# Patient Record
Sex: Female | Born: 1975 | Race: Black or African American | Hispanic: Yes | Marital: Single | Smoking: Current every day smoker
Health system: Southern US, Community
[De-identification: ages and names within clinical notes are randomized; demographics above are authoritative.]

## PROBLEM LIST (undated history)

## (undated) DIAGNOSIS — Z789 Other specified health status: Secondary | ICD-10-CM

## (undated) DIAGNOSIS — O24419 Gestational diabetes mellitus in pregnancy, unspecified control: Secondary | ICD-10-CM

## (undated) HISTORY — DX: Gestational diabetes mellitus in pregnancy, unspecified control: O24.419

## (undated) HISTORY — PX: NO PAST SURGERIES: SHX2092

---
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
## 2018-06-20 ENCOUNTER — Ambulatory Visit: Payer: Self-pay

## 2019-01-09 ENCOUNTER — Ambulatory Visit: Payer: Self-pay

## 2019-01-15 ENCOUNTER — Telehealth: Payer: Self-pay | Admitting: *Deleted

## 2019-01-15 ENCOUNTER — Ambulatory Visit: Payer: Self-pay

## 2019-01-15 NOTE — Telephone Encounter (Signed)
Tried to call patient regarding appointment this AM. No answer, no voicemail. Nurse will be out of office.   Clovis Pu, RN

## 2019-01-23 ENCOUNTER — Ambulatory Visit (INDEPENDENT_AMBULATORY_CARE_PROVIDER_SITE_OTHER): Payer: Self-pay | Admitting: *Deleted

## 2019-01-23 ENCOUNTER — Other Ambulatory Visit: Payer: Self-pay

## 2019-01-23 ENCOUNTER — Encounter: Payer: Self-pay | Admitting: General Practice

## 2019-01-23 VITALS — BP 111/72 | HR 102 | Temp 98.0°F | Ht 62.0 in | Wt 148.0 lb

## 2019-01-23 DIAGNOSIS — Z3490 Encounter for supervision of normal pregnancy, unspecified, unspecified trimester: Secondary | ICD-10-CM

## 2019-01-23 DIAGNOSIS — Z3200 Encounter for pregnancy test, result unknown: Secondary | ICD-10-CM

## 2019-01-23 DIAGNOSIS — Z3201 Encounter for pregnancy test, result positive: Secondary | ICD-10-CM

## 2019-01-23 LAB — POCT URINE PREGNANCY: Preg Test, Ur: POSITIVE — AB

## 2019-01-23 MED ORDER — ONE-A-DAY WOMENS PRENATAL 1 28-0.8-235 MG PO CAPS: 1.0000 | ORAL_CAPSULE | Freq: Every day | ORAL | 0 refills | Status: DC

## 2019-01-23 NOTE — Addendum Note (Signed)
Addended by: Derl Barrow on: 01/23/2019 02:12 PM   Modules accepted: Orders

## 2019-01-23 NOTE — Progress Notes (Signed)
   Ms. Askari presents today for UPT. She has no unusual complaints. LMP: 12/07/2018    OBJECTIVE: Appears well, in no apparent distress.  OB History    Gravida  1   Para      Term      Preterm      AB      Living        SAB      TAB      Ectopic      Multiple      Live Births             Home UPT Result: Positive In-Office UPT result: Positive I have reviewed the patient's medical, obstetrical, social, and family histories, and medications.   ASSESSMENT: Positive pregnancy test  PLAN Prenatal care to be completed at: [facility]  Derl Barrow, RN

## 2019-01-30 ENCOUNTER — Other Ambulatory Visit: Payer: Self-pay

## 2019-01-30 ENCOUNTER — Ambulatory Visit (INDEPENDENT_AMBULATORY_CARE_PROVIDER_SITE_OTHER): Payer: Self-pay | Admitting: *Deleted

## 2019-01-30 VITALS — Wt 148.0 lb

## 2019-01-30 DIAGNOSIS — Z3480 Encounter for supervision of other normal pregnancy, unspecified trimester: Secondary | ICD-10-CM

## 2019-01-30 DIAGNOSIS — O0990 Supervision of high risk pregnancy, unspecified, unspecified trimester: Secondary | ICD-10-CM | POA: Insufficient documentation

## 2019-01-30 NOTE — Progress Notes (Signed)
    Virtual Visit via Telephone Note  I connected with [name] on 01/30/19 at  2:30 PM EDT by telephone and verified that I am speaking with the correct person using two identifiers.  Location: Patient: Regina Cross MRN: [id number] Provider: Derl Barrow, RN   I discussed the limitations, risks, security and privacy concerns of performing an evaluation and management service by telephone and the availability of in person appointments. I also discussed with the patient that there may be a patient responsible charge related to this service. The patient expressed understanding and agreed to proceed.   History of Present Illness: PRENATAL INTAKE SUMMARY  [name] presents today New OB Nurse Interview.  OB History    Gravida  6   Para  3   Term  3   Preterm      AB  2   Living  3     SAB  2   TAB      Ectopic      Multiple      Live Births  3          I have reviewed the patient's medical, obstetrical, social, and family histories, medications, and available lab results.  SUBJECTIVE She has no unusual complaints   Observations/Objective: Initial nurse interview for history (New OB).  EDD: 09/13/2019 by LMP GA: [redacted]w[redacted]d G6P3023  GENERAL APPEARANCE: oriented to person, place and time, non-face to face; telephone visit.  Assessment and Plan: Normal pregnancy Prenatal care [facility] Lab work will be completed at next visit. Ultrasound <14 weeks ordered. Continue PNV  Follow Up Instructions:  I discussed the assessment and treatment plan with the patient. The patient was provided an opportunity to ask questions and all were answered. The patient agreed with the plan and demonstrated an understanding of the instructions.   The patient was advised to call back or seek an in-person evaluation if the symptoms worsen or if the condition fails to improve as anticipated.  I provided 20 minutes of non-face-to-face time during this encounter.   Derl Barrow, RN

## 2019-02-08 ENCOUNTER — Ambulatory Visit (HOSPITAL_COMMUNITY): Payer: Self-pay

## 2019-02-15 ENCOUNTER — Telehealth: Payer: Self-pay | Admitting: Obstetrics and Gynecology

## 2019-02-15 NOTE — Telephone Encounter (Signed)
Called the patient to complete the pre-screen. The patient answered no to COVID19 symptoms and/or being previously diagnosed. Informed the patient of the wearing a face mask, sanitizing hands at the sanitizing station upon entering our office, and no visitors or children are allowed due to the COVID19 restrictions. The patient verbalized understanding. °

## 2019-02-19 ENCOUNTER — Ambulatory Visit (HOSPITAL_COMMUNITY): Admission: RE | Admit: 2019-02-19 | Payer: Self-pay | Source: Ambulatory Visit

## 2019-02-19 ENCOUNTER — Ambulatory Visit: Payer: Self-pay

## 2019-02-21 ENCOUNTER — Ambulatory Visit (INDEPENDENT_AMBULATORY_CARE_PROVIDER_SITE_OTHER): Payer: Self-pay

## 2019-02-21 ENCOUNTER — Ambulatory Visit (HOSPITAL_COMMUNITY)
Admission: RE | Admit: 2019-02-21 | Discharge: 2019-02-21 | Disposition: A | Discharge status: Home / Self Care | Payer: Self-pay | Source: Ambulatory Visit | Attending: Obstetrics and Gynecology | Admitting: Obstetrics and Gynecology

## 2019-02-21 ENCOUNTER — Other Ambulatory Visit: Payer: Self-pay

## 2019-02-21 DIAGNOSIS — Z3480 Encounter for supervision of other normal pregnancy, unspecified trimester: Secondary | ICD-10-CM | POA: Insufficient documentation

## 2019-02-21 DIAGNOSIS — Z712 Person consulting for explanation of examination or test findings: Secondary | ICD-10-CM

## 2019-02-21 NOTE — Progress Notes (Signed)
Pt here today for OB US results.  Per results pt has possible cystic hygroma which requires a provider to speak with pt.  Due to the full provider schedule [place] agreed to pt being scheduled at [facility] (pt belongs to practice) for an appt for results.  Pt scheduled an appt for July 9 @ 1515 for MyChart visit for results.  Notified pt of appt and the reason why.  Pt verbalized understanding with no further questions.  Pt given [place] pics.    Mel Almond, RN 02/21/19

## 2019-02-22 ENCOUNTER — Telehealth: Payer: Self-pay | Admitting: General Practice

## 2019-02-22 ENCOUNTER — Telehealth: Payer: Self-pay | Admitting: Obstetrics and Gynecology

## 2019-02-22 ENCOUNTER — Other Ambulatory Visit: Payer: Self-pay | Admitting: Obstetrics and Gynecology

## 2019-02-22 NOTE — Telephone Encounter (Signed)
Attempted to call pt twice to check in for Mychart video visit.  Unable to leave a message due to voice mail not set up.

## 2019-02-23 ENCOUNTER — Telehealth (INDEPENDENT_AMBULATORY_CARE_PROVIDER_SITE_OTHER): Payer: Self-pay | Admitting: Family

## 2019-02-23 DIAGNOSIS — O283 Abnormal ultrasonic finding on antenatal screening of mother: Secondary | ICD-10-CM

## 2019-02-23 NOTE — Progress Notes (Signed)
   TELEHEALTH VIRTUAL OBSTETRICS VISIT ENCOUNTER NOTE  I connected with Regina Cross on 02/23/19 at  9:10 AM EDT by video visit at home and verified that I am speaking with the correct person using two identifiers.   I discussed the limitations, risks, security and privacy concerns of performing an evaluation and management service by telephone and the availability of in person appointments. I also discussed with the patient that there may be a patient responsible charge related to this service. The patient expressed understanding and agreed to proceed.  Regina Cross is a 43 y.o. [id number] at [redacted]w[redacted]d being followed for ongoing prenatal care.  She is currently monitored for the following issues for this high-risk pregnancy.  Pt called to review 1st trimester ultrasound results:  CRL gives approximate gestational age of 11.1 day.  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   Reviewed with patient results of ultrasound and explained that cystic hygromas are associated with an increased risk for fetal aneuploidy (particularly trisomy 21) and other structural malformations.  Recommended meeting with genetic counselor and MFM to review additional screening and diagnostic options.  Briefly reviewed NIPS, CVS, and amnio.  Plan includes to schedule genetic counseling and MFM visit.  Pt given opportunity to ask additional questions.  No questions at this time.   Cyndee Brightly, CNM

## 2019-03-05 ENCOUNTER — Ambulatory Visit (HOSPITAL_COMMUNITY): Payer: Self-pay

## 2019-03-07 ENCOUNTER — Ambulatory Visit (HOSPITAL_COMMUNITY): Payer: Self-pay | Attending: Obstetrics and Gynecology

## 2019-03-07 ENCOUNTER — Encounter: Payer: Self-pay | Admitting: General Practice

## 2019-03-07 ENCOUNTER — Encounter: Payer: Self-pay | Admitting: Obstetrics and Gynecology

## 2019-03-07 ENCOUNTER — Telehealth: Payer: Self-pay | Admitting: General Practice

## 2019-03-07 NOTE — Telephone Encounter (Signed)
Unable to reach pt to reschedule appointment and also unable to leave a message on voice mail d/t voice mail not set up.  Letter mailed to patient.

## 2019-03-28 ENCOUNTER — Encounter: Payer: Self-pay | Admitting: Obstetrics & Gynecology

## 2019-03-28 ENCOUNTER — Encounter: Payer: Self-pay | Admitting: Family Medicine

## 2019-04-18 ENCOUNTER — Other Ambulatory Visit: Payer: Self-pay

## 2019-04-18 ENCOUNTER — Ambulatory Visit (INDEPENDENT_AMBULATORY_CARE_PROVIDER_SITE_OTHER): Payer: Self-pay | Admitting: Obstetrics & Gynecology

## 2019-04-18 ENCOUNTER — Encounter: Payer: Self-pay | Admitting: Obstetrics & Gynecology

## 2019-04-18 VITALS — BP 108/70 | HR 71 | Temp 98.4°F | Wt 148.4 lb

## 2019-04-18 DIAGNOSIS — O09522 Supervision of elderly multigravida, second trimester: Secondary | ICD-10-CM

## 2019-04-18 DIAGNOSIS — Z3A18 18 weeks gestation of pregnancy: Secondary | ICD-10-CM

## 2019-04-18 DIAGNOSIS — O0990 Supervision of high risk pregnancy, unspecified, unspecified trimester: Secondary | ICD-10-CM

## 2019-04-18 DIAGNOSIS — O09529 Supervision of elderly multigravida, unspecified trimester: Secondary | ICD-10-CM | POA: Insufficient documentation

## 2019-04-18 DIAGNOSIS — O283 Abnormal ultrasonic finding on antenatal screening of mother: Secondary | ICD-10-CM

## 2019-04-18 DIAGNOSIS — O0992 Supervision of high risk pregnancy, unspecified, second trimester: Secondary | ICD-10-CM

## 2019-04-18 NOTE — Progress Notes (Signed)
  Subjective:    Regina Cross is a [id number] [redacted]w[redacted]d being seen today for her first obstetrical visit.  Her obstetrical history is significant for advanced maternal age and possible cystic hygroma at 11 weeks. Patient does not intend to breast feed. Pregnancy history fully reviewed.  Patient reports no complaints.  Vitals:   04/18/19 1539  BP: 108/70  Pulse: 71  Temp: 98.4 F (36.9 C)  Weight: 148 lb 6.4 oz (67.3 kg)    HISTORY: OB History  Gravida Para Term Preterm AB Living  6 3 3   2 3   SAB TAB Ectopic Multiple Live Births  2       3    # Outcome Date GA Lbr Len/2nd Weight Sex Delivery Anes PTL Lv  6 Current           5 Term 07/23/07     Vag-Spont  N LIV  4 Term 09/14/98     Vag-Spont  N LIV  3 Term 10/31/93     Vag-Spont  N LIV  2 SAB           1 SAB            Past Medical History:  Diagnosis Date  . Medical history non-contributory    Past Surgical History:  Procedure Laterality Date  . NO PAST SURGERIES     Family History  Problem Relation Age of Onset  . Hypertension Mother      Exam    Uterus:   19 weeks  Pelvic Exam:    Perineum: Not performed   Vulva:    Vagina:     pH:    Cervix:    Adnexa:    Bony Pelvis:   System: Breast:  normal appearance, no masses or tenderness, positive findings: nipple inversion on the right   Skin: normal coloration and turgor, no rashes    Neurologic: oriented, normal mood   Extremities: normal strength, tone, and muscle mass   HEENT PERRLA, extra ocular movement intact and neck supple with midline trachea   Mouth/Teeth mucous membranes moist, pharynx normal without lesions   Neck supple   Cardiovascular: regular rate and rhythm, no murmurs or gallops   Respiratory:  appears well, vitals normal, no respiratory distress, acyanotic, normal RR, neck free of mass or lymphadenopathy, chest clear, no wheezing, crepitations, rhonchi, normal symmetric air entry   Abdomen: gravid   Urinary:        Assessment:    Pregnancy: A5W0981 Patient Active Problem List   Diagnosis Date Noted  . Abnormal fetal ultrasound 02/23/2019  . Supervision of high risk pregnancy, antepartum 01/30/2019        Plan:     Initial labs drawn. Prenatal vitamins. Problem list reviewed and updated. Genetic Screening discussed Panorama, genetic counseling re AMA and cystic hygroma  Ultrasound discussed; fetal survey: ordered.  Follow up in 4 weeks. 50% of 30 min visit spent on counseling and coordination of care.     Emeterio Reeve 04/18/2019

## 2019-04-18 NOTE — Patient Instructions (Signed)

## 2019-04-19 LAB — OBSTETRIC PANEL, INCLUDING HIV
Antibody Screen: NEGATIVE
Basophils Absolute: 0 10*3/uL (ref 0.0–0.2)
Basos: 0 %
EOS (ABSOLUTE): 0.1 10*3/uL (ref 0.0–0.4)
Eos: 1 %
HIV Screen 4th Generation wRfx: NONREACTIVE
Hematocrit: 31.7 % — ABNORMAL LOW (ref 34.0–46.6)
Hemoglobin: 10.6 g/dL — ABNORMAL LOW (ref 11.1–15.9)
Hepatitis B Surface Ag: NEGATIVE
Immature Grans (Abs): 0 10*3/uL (ref 0.0–0.1)
Immature Granulocytes: 0 %
Lymphocytes Absolute: 2.6 10*3/uL (ref 0.7–3.1)
Lymphs: 29 %
MCH: 29 pg (ref 26.6–33.0)
MCHC: 33.4 g/dL (ref 31.5–35.7)
MCV: 87 fL (ref 79–97)
Monocytes Absolute: 0.7 10*3/uL (ref 0.1–0.9)
Monocytes: 8 %
Neutrophils Absolute: 5.4 10*3/uL (ref 1.4–7.0)
Neutrophils: 62 %
Platelets: 268 10*3/uL (ref 150–450)
RBC: 3.66 x10E6/uL — ABNORMAL LOW (ref 3.77–5.28)
RDW: 12.2 % (ref 11.7–15.4)
RPR Ser Ql: NONREACTIVE
Rh Factor: POSITIVE
Rubella Antibodies, IGG: 13.4 index (ref >0.99)
WBC: 8.9 10*3/uL (ref 3.4–10.8)

## 2019-04-19 LAB — HEMOGLOBIN A1C
Est. average glucose Bld gHb Est-mCnc: 100 mg/dL
Hgb A1c MFr Bld: 5.1 % (ref 4.8–5.6)

## 2019-04-20 LAB — URINE CULTURE, OB REFLEX: Organism ID, Bacteria: NO GROWTH

## 2019-04-20 LAB — CULTURE, OB URINE

## 2019-04-24 ENCOUNTER — Encounter: Payer: Self-pay | Admitting: *Deleted

## 2019-05-02 ENCOUNTER — Other Ambulatory Visit (HOSPITAL_COMMUNITY): Payer: Self-pay | Admitting: *Deleted

## 2019-05-02 ENCOUNTER — Encounter (HOSPITAL_COMMUNITY): Payer: Self-pay

## 2019-05-02 ENCOUNTER — Ambulatory Visit (HOSPITAL_COMMUNITY): Payer: Self-pay

## 2019-05-02 ENCOUNTER — Ambulatory Visit (HOSPITAL_COMMUNITY): Payer: Self-pay | Admitting: *Deleted

## 2019-05-02 ENCOUNTER — Ambulatory Visit (INDEPENDENT_AMBULATORY_CARE_PROVIDER_SITE_OTHER): Payer: Self-pay | Admitting: Obstetrics and Gynecology

## 2019-05-02 ENCOUNTER — Other Ambulatory Visit: Payer: Self-pay

## 2019-05-02 ENCOUNTER — Ambulatory Visit (HOSPITAL_COMMUNITY)
Admission: RE | Admit: 2019-05-02 | Discharge: 2019-05-02 | Disposition: A | Discharge status: Home / Self Care | Payer: Self-pay | Source: Ambulatory Visit | Attending: Obstetrics and Gynecology | Admitting: Obstetrics and Gynecology

## 2019-05-02 VITALS — BP 124/84 | HR 79 | Wt 149.7 lb

## 2019-05-02 DIAGNOSIS — Z362 Encounter for other antenatal screening follow-up: Secondary | ICD-10-CM

## 2019-05-02 DIAGNOSIS — O0990 Supervision of high risk pregnancy, unspecified, unspecified trimester: Secondary | ICD-10-CM

## 2019-05-02 DIAGNOSIS — O09522 Supervision of elderly multigravida, second trimester: Secondary | ICD-10-CM

## 2019-05-02 DIAGNOSIS — Z9119 Patient's noncompliance with other medical treatment and regimen: Secondary | ICD-10-CM | POA: Insufficient documentation

## 2019-05-02 DIAGNOSIS — Z91199 Patient's noncompliance with other medical treatment and regimen due to unspecified reason: Secondary | ICD-10-CM | POA: Insufficient documentation

## 2019-05-02 DIAGNOSIS — O283 Abnormal ultrasonic finding on antenatal screening of mother: Secondary | ICD-10-CM

## 2019-05-02 DIAGNOSIS — Z3A20 20 weeks gestation of pregnancy: Secondary | ICD-10-CM

## 2019-05-02 DIAGNOSIS — O0992 Supervision of high risk pregnancy, unspecified, second trimester: Secondary | ICD-10-CM

## 2019-05-02 DIAGNOSIS — O359XX0 Maternal care for (suspected) fetal abnormality and damage, unspecified, not applicable or unspecified: Secondary | ICD-10-CM

## 2019-05-02 MED ORDER — PREPLUS 27-1 MG PO TABS: 1.0000 | ORAL_TABLET | Freq: Every day | ORAL | 3 refills | Status: DC

## 2019-05-02 MED ORDER — ASPIRIN EC 81 MG PO TBEC: 81.0000 mg | DELAYED_RELEASE_TABLET | Freq: Every day | ORAL | 3 refills | Status: DC

## 2019-05-02 NOTE — Progress Notes (Signed)
Prenatal Visit Note Date: 05/02/2019 Clinic: [facility]  Subjective:  Regina Cross is a 43 y.o. [id number] at [redacted]w[redacted]d being seen today for ongoing prenatal care.  She is currently monitored for the following issues for this high-risk pregnancy and has Supervision of high risk pregnancy, antepartum; Abnormal fetal ultrasound; AMA (advanced maternal age) multigravida 35+; and Patient noncompliance on their problem list.  Patient reports no complaints.   Contractions: Not present. Vag. Bleeding: None.  Movement: Present. Denies leaking of fluid.   The following portions of the patient's history were reviewed and updated as appropriate: allergies, current medications, past family history, past medical history, past social history, past surgical history and problem list. Problem list updated.  Objective:   Vitals:   05/02/19 1524  BP: 124/84  Pulse: 79  Weight: 149 lb 11.2 oz (67.9 kg)    Fetal Status: Fetal Heart Rate (bpm): 149   Movement: Present     General:  Alert, oriented and cooperative. Patient is in no acute distress.  Skin: Skin is warm and dry. No rash noted.   Cardiovascular: Normal heart rate noted  Respiratory: Normal respiratory effort, no problems with respiration noted  Abdomen: Soft, gravid, appropriate for gestational age. Pain/Pressure: Present     Pelvic:  Cervical exam deferred        Extremities: Normal range of motion.  Edema: None  Mental Status: Normal mood and affect. Normal behavior. Normal judgment and thought content.   Urinalysis:      Assessment and Plan:  Pregnancy: F6C1275 at [redacted]w[redacted]d  1. Supervision of high risk pregnancy, antepartum Recommended that she start low dose asa which she is amenable to Pap smear needed at some point Patient at high risk for pre-eclampsia given age and likely abnormal pregnancy. Recommend closer in office follow up.  - TSH - Protein / creatinine ratio, urine - Comprehensive metabolic panel - [place]  MFM OB FOLLOW UP; Future - AMB referral to maternal fetal medicine  2. Abnormal fetal ultrasound Follow up read from anatomy u/s from today Patient has missed many visits with [place] and MFM, which she states is due to her being in denial. Panorama cffdna still pending, but I told her that there's a high chance of fetal aneuploidy given her age and 11wk u/s findings. I d/w her re: amniocentesis and she's interested in this and would like to discuss with mfm more. Will set this up.  - [place] MFM OB FOLLOW UP; Future - AMB referral to maternal fetal medicine  3. Multigravida of advanced maternal age in second trimester Baseline labs - TSH - Protein / creatinine ratio, urine - Comprehensive metabolic panel - [place] MFM OB FOLLOW UP; Future - AMB referral to maternal fetal medicine  4. Patient noncompliance See above  Preterm labor symptoms and general obstetric precautions including but not limited to vaginal bleeding, contractions, leaking of fluid and fetal movement were reviewed in detail with the patient. Please refer to After Visit Summary for other counseling recommendations.  RTC: 2-3 wks.    Aletha Halim, MD

## 2019-05-03 LAB — COMPREHENSIVE METABOLIC PANEL
ALT: 22 IU/L (ref 0–32)
AST: 20 IU/L (ref 0–40)
Albumin/Globulin Ratio: 1.7 (ref 1.2–2.2)
Albumin: 4 g/dL (ref 3.8–4.8)
Alkaline Phosphatase: 75 IU/L (ref 39–117)
BUN/Creatinine Ratio: 18 (ref 9–23)
BUN: 9 mg/dL (ref 6–24)
Bilirubin Total: 0.2 mg/dL (ref 0.0–1.2)
CO2: 21 mmol/L (ref 20–29)
Calcium: 9.2 mg/dL (ref 8.7–10.2)
Chloride: 104 mmol/L (ref 96–106)
Creatinine, Ser: 0.5 mg/dL — ABNORMAL LOW (ref 0.57–1.00)
GFR calc Af Amer: 138 mL/min/{1.73_m2} (ref >59)
GFR calc non Af Amer: 120 mL/min/{1.73_m2} (ref >59)
Globulin, Total: 2.4 g/dL (ref 1.5–4.5)
Glucose: 79 mg/dL (ref 65–99)
Potassium: 4.3 mmol/L (ref 3.5–5.2)
Sodium: 136 mmol/L (ref 134–144)
Total Protein: 6.4 g/dL (ref 6.0–8.5)

## 2019-05-03 LAB — TSH: TSH: 1.98 u[IU]/mL (ref 0.450–4.500)

## 2019-05-18 ENCOUNTER — Encounter: Payer: Self-pay | Admitting: Obstetrics and Gynecology

## 2019-05-21 ENCOUNTER — Encounter: Payer: Self-pay | Admitting: *Deleted

## 2019-05-23 ENCOUNTER — Other Ambulatory Visit: Payer: Self-pay

## 2019-05-23 ENCOUNTER — Ambulatory Visit (INDEPENDENT_AMBULATORY_CARE_PROVIDER_SITE_OTHER): Payer: Self-pay | Admitting: Family Medicine

## 2019-05-23 VITALS — BP 106/70 | HR 88 | Wt 149.0 lb

## 2019-05-23 DIAGNOSIS — O283 Abnormal ultrasonic finding on antenatal screening of mother: Secondary | ICD-10-CM

## 2019-05-23 DIAGNOSIS — O0992 Supervision of high risk pregnancy, unspecified, second trimester: Secondary | ICD-10-CM

## 2019-05-23 DIAGNOSIS — O09522 Supervision of elderly multigravida, second trimester: Secondary | ICD-10-CM

## 2019-05-23 DIAGNOSIS — Z3A23 23 weeks gestation of pregnancy: Secondary | ICD-10-CM

## 2019-05-23 DIAGNOSIS — O0990 Supervision of high risk pregnancy, unspecified, unspecified trimester: Secondary | ICD-10-CM

## 2019-05-23 NOTE — Patient Instructions (Signed)

## 2019-05-23 NOTE — Progress Notes (Signed)
   PRENATAL VISIT NOTE  Subjective:  Regina Cross is a 43 y.o. [phone] at [redacted]w[redacted]d being seen today for ongoing prenatal care.  She is currently monitored for the following issues for this high-risk pregnancy and has Supervision of high risk pregnancy, antepartum; Abnormal fetal ultrasound; AMA (advanced maternal age) multigravida 35+; and Patient noncompliance on their problem list.  Patient reports no complaints.  Contractions: Not present. Vag. Bleeding: None.  Movement: Present. Denies leaking of fluid.   The following portions of the patient's history were reviewed and updated as appropriate: allergies, current medications, past family history, past medical history, past social history, past surgical history and problem list.   Objective:   Vitals:   05/23/19 1629  BP: 106/70  Pulse: 88  Weight: 149 lb (67.6 kg)    Fetal Status: Fetal Heart Rate (bpm): 152   Movement: Present     General:  Alert, oriented and cooperative. Patient is in no acute distress.  Skin: Skin is warm and dry. No rash noted.   Cardiovascular: Normal heart rate noted  Respiratory: Normal respiratory effort, no problems with respiration noted  Abdomen: Soft, gravid, appropriate for gestational age.  Pain/Pressure: Present     Pelvic: Cervical exam deferred        Extremities: Normal range of motion.  Edema: None  Mental Status: Normal mood and affect. Normal behavior. Normal judgment and thought content.   Assessment and Plan:  Pregnancy: [id number] at [redacted]w[redacted]d 1. Supervision of high risk pregnancy, antepartum Continue prenatal care.   2. Multigravida of advanced maternal age in second trimester Normal NIPT  3. Abnormal fetal ultrasound Cystic hygroma resolved--missed her fetal  Echo--with normal NIPT, is this still needed? If so, we will re-schedule after MFM f/u.  Preterm labor symptoms and general obstetric precautions including but not limited to vaginal bleeding, contractions, leaking of fluid and  fetal movement were reviewed in detail with the patient. Please refer to After Visit Summary for other counseling recommendations.   Return in about 3 weeks (around 06/13/2019) for in person, HRC, 28 wk labs + GTT.  Future Appointments  Date Time Provider [place]  05/28/2019  3:45 PM [place] NURSE [place] [facility]  05/28/2019  3:45 PM [place] 2 WH-MFCUS MFC-US  06/14/2019  9:30 AM [facility] WOC[facility] WOC  06/14/2019  9:55 AM [place] Mode, MD WOC-W[facility] WOC    Donnamae Jude, MD

## 2019-05-24 ENCOUNTER — Encounter: Payer: Self-pay | Admitting: *Deleted

## 2019-05-28 ENCOUNTER — Ambulatory Visit (HOSPITAL_COMMUNITY): Payer: Self-pay | Admitting: *Deleted

## 2019-05-28 ENCOUNTER — Encounter (HOSPITAL_COMMUNITY): Payer: Self-pay

## 2019-05-28 ENCOUNTER — Ambulatory Visit (HOSPITAL_COMMUNITY)
Admission: RE | Admit: 2019-05-28 | Discharge: 2019-05-28 | Disposition: A | Discharge status: Home / Self Care | Payer: Self-pay | Source: Ambulatory Visit | Attending: Obstetrics and Gynecology | Admitting: Obstetrics and Gynecology

## 2019-05-28 ENCOUNTER — Other Ambulatory Visit: Payer: Self-pay

## 2019-05-28 DIAGNOSIS — O0990 Supervision of high risk pregnancy, unspecified, unspecified trimester: Secondary | ICD-10-CM | POA: Insufficient documentation

## 2019-05-28 DIAGNOSIS — Z362 Encounter for other antenatal screening follow-up: Secondary | ICD-10-CM | POA: Insufficient documentation

## 2019-05-28 DIAGNOSIS — O283 Abnormal ultrasonic finding on antenatal screening of mother: Secondary | ICD-10-CM | POA: Insufficient documentation

## 2019-05-28 DIAGNOSIS — O4442 Low lying placenta NOS or without hemorrhage, second trimester: Secondary | ICD-10-CM

## 2019-05-28 DIAGNOSIS — O09522 Supervision of elderly multigravida, second trimester: Secondary | ICD-10-CM

## 2019-05-28 DIAGNOSIS — O359XX0 Maternal care for (suspected) fetal abnormality and damage, unspecified, not applicable or unspecified: Secondary | ICD-10-CM

## 2019-05-28 DIAGNOSIS — Z3A24 24 weeks gestation of pregnancy: Secondary | ICD-10-CM

## 2019-05-29 ENCOUNTER — Encounter: Payer: Self-pay | Admitting: General Practice

## 2019-05-29 ENCOUNTER — Other Ambulatory Visit (HOSPITAL_COMMUNITY): Payer: Self-pay | Admitting: *Deleted

## 2019-05-29 DIAGNOSIS — O09529 Supervision of elderly multigravida, unspecified trimester: Secondary | ICD-10-CM

## 2019-05-29 DIAGNOSIS — O358XX0 Maternal care for other (suspected) fetal abnormality and damage, not applicable or unspecified: Secondary | ICD-10-CM

## 2019-06-07 ENCOUNTER — Other Ambulatory Visit: Payer: Self-pay | Admitting: Lactation Services

## 2019-06-07 DIAGNOSIS — O0990 Supervision of high risk pregnancy, unspecified, unspecified trimester: Secondary | ICD-10-CM

## 2019-06-14 ENCOUNTER — Other Ambulatory Visit: Payer: Self-pay

## 2019-06-14 ENCOUNTER — Ambulatory Visit (INDEPENDENT_AMBULATORY_CARE_PROVIDER_SITE_OTHER): Payer: Self-pay | Admitting: Obstetrics & Gynecology

## 2019-06-14 VITALS — BP 116/76 | HR 103 | Wt 153.2 lb

## 2019-06-14 DIAGNOSIS — O0992 Supervision of high risk pregnancy, unspecified, second trimester: Secondary | ICD-10-CM

## 2019-06-14 DIAGNOSIS — O0990 Supervision of high risk pregnancy, unspecified, unspecified trimester: Secondary | ICD-10-CM

## 2019-06-14 DIAGNOSIS — O4422 Partial placenta previa NOS or without hemorrhage, second trimester: Secondary | ICD-10-CM

## 2019-06-14 DIAGNOSIS — O09523 Supervision of elderly multigravida, third trimester: Secondary | ICD-10-CM

## 2019-06-14 DIAGNOSIS — O09522 Supervision of elderly multigravida, second trimester: Secondary | ICD-10-CM

## 2019-06-14 DIAGNOSIS — Z3A27 27 weeks gestation of pregnancy: Secondary | ICD-10-CM

## 2019-06-14 DIAGNOSIS — O4420 Partial placenta previa NOS or without hemorrhage, unspecified trimester: Secondary | ICD-10-CM

## 2019-06-14 DIAGNOSIS — Z23 Encounter for immunization: Secondary | ICD-10-CM

## 2019-06-14 DIAGNOSIS — O283 Abnormal ultrasonic finding on antenatal screening of mother: Secondary | ICD-10-CM

## 2019-06-14 NOTE — Progress Notes (Signed)
   PRENATAL VISIT NOTE  Subjective:  Regina Cross is a 43 y.o. [phone] at [redacted]w[redacted]d being seen today for ongoing prenatal care.  She is currently monitored for the following issues for this high-risk pregnancy and has Supervision of high risk pregnancy, antepartum; Abnormal fetal ultrasound; AMA (advanced maternal age) multigravida 35+; and Patient noncompliance on their problem list.  Patient reports no complaints.  Contractions: Not present. Vag. Bleeding: None.  Movement: Present. Denies leaking of fluid.   The following portions of the patient's history were reviewed and updated as appropriate: allergies, current medications, past family history, past medical history, past social history, past surgical history and problem list.   Objective:   Vitals:   06/14/19 0955  BP: 116/76  Pulse: (!) 103  Weight: 153 lb 3.2 oz (69.5 kg)    Fetal Status: Fetal Heart Rate (bpm): 149 Fundal Height: 27 cm Movement: Present     General:  Alert, oriented and cooperative. Patient is in no acute distress.  Skin: Skin is warm and dry. No rash noted.   Cardiovascular: Normal heart rate noted  Respiratory: Normal respiratory effort, no problems with respiration noted  Abdomen: Soft, gravid, appropriate for gestational age.  Pain/Pressure: Absent     Pelvic: Cervical exam deferred        Extremities: Normal range of motion.  Edema: Trace  Mental Status: Normal mood and affect. Normal behavior. Normal judgment and thought content.   Assessment and Plan:  Pregnancy: [id number] at [redacted]w[redacted]d 1. Supervision of high risk pregnancy, antepartum Routine testing - Tdap vaccine greater than or equal to 7yo IM  2. Multigravida of advanced maternal age in third trimester F/u US for growth and placenta  3. Abnormal fetal ultrasound  Cystic hygoma resolved Preterm labor symptoms and general obstetric precautions including but not limited to vaginal bleeding, contractions, leaking of fluid and fetal movement were  reviewed in detail with the patient. Please refer to After Visit Summary for other counseling recommendations.   Return in about 2 weeks (around 06/28/2019) for 2 hr GTT.  Future Appointments  Date Time Provider [place]  06/25/2019  3:45 PM [place] NURSE [place] [facility]  06/25/2019  3:45 PM [place] 2 WH-MFCUS MFC-US    Emeterio Reeve, MD

## 2019-06-14 NOTE — Patient Instructions (Signed)

## 2019-06-15 LAB — GLUCOSE TOLERANCE, 2 HOURS W/ 1HR
Glucose, 1 hour: 123 mg/dL (ref 65–179)
Glucose, 2 hour: 108 mg/dL (ref 65–152)
Glucose, Fasting: 99 mg/dL — ABNORMAL HIGH (ref 65–91)

## 2019-06-15 LAB — CBC
Hematocrit: 29.7 % — ABNORMAL LOW (ref 34.0–46.6)
Hemoglobin: 9.8 g/dL — ABNORMAL LOW (ref 11.1–15.9)
MCH: 28.3 pg (ref 26.6–33.0)
MCHC: 33 g/dL (ref 31.5–35.7)
MCV: 86 fL (ref 79–97)
Platelets: 245 10*3/uL (ref 150–450)
RBC: 3.46 x10E6/uL — ABNORMAL LOW (ref 3.77–5.28)
RDW: 12.6 % (ref 11.7–15.4)
WBC: 10.1 10*3/uL (ref 3.4–10.8)

## 2019-06-15 LAB — HIV ANTIBODY (ROUTINE TESTING W REFLEX): HIV Screen 4th Generation wRfx: NONREACTIVE

## 2019-06-15 LAB — RPR: RPR Ser Ql: NONREACTIVE

## 2019-06-19 ENCOUNTER — Telehealth: Payer: Self-pay | Admitting: *Deleted

## 2019-06-19 DIAGNOSIS — O0990 Supervision of high risk pregnancy, unspecified, unspecified trimester: Secondary | ICD-10-CM

## 2019-06-19 DIAGNOSIS — O24419 Gestational diabetes mellitus in pregnancy, unspecified control: Secondary | ICD-10-CM

## 2019-06-19 NOTE — Telephone Encounter (Signed)
I called and notified Regina Cross of her 2 hour GTT results and that she did not pass the screen which means she has Gestational Diabetes. I explained she will get a call from our registrar to schedule an appointment with our Diabetes educator to be given information about GDM, how to check  Her blood sugar and the diet she should follow. 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 I asked if she has a family member who could be taught how to check her blood sugar. She states she does not want anyone not medically trained to stick her fingers. She is open to having blood draws in the office.  I explained she will still need to be taught about what diet she should follow.  I also explained I will send a message to Dr.Arnold to let him know about her not checking her blood sugar and if  He has new orders; we will get back to her.  She voices understanding.  Regina Higginbotham,RN

## 2019-06-19 NOTE — Telephone Encounter (Signed)
-----   Message from Woodroe Mode, MD sent at 06/18/2019 11:16 AM EST ----- Needs appt with diabetes management due to elevated FBS on 2 hr GTT

## 2019-06-25 ENCOUNTER — Ambulatory Visit (HOSPITAL_COMMUNITY): Payer: Self-pay | Admitting: *Deleted

## 2019-06-25 ENCOUNTER — Other Ambulatory Visit: Payer: Self-pay

## 2019-06-25 ENCOUNTER — Ambulatory Visit (HOSPITAL_COMMUNITY)
Admission: RE | Admit: 2019-06-25 | Discharge: 2019-06-25 | Disposition: A | Discharge status: Home / Self Care | Payer: Self-pay | Source: Ambulatory Visit | Attending: Obstetrics and Gynecology | Admitting: Obstetrics and Gynecology

## 2019-06-25 ENCOUNTER — Encounter (HOSPITAL_COMMUNITY): Payer: Self-pay | Admitting: *Deleted

## 2019-06-25 DIAGNOSIS — O359XX0 Maternal care for (suspected) fetal abnormality and damage, unspecified, not applicable or unspecified: Secondary | ICD-10-CM

## 2019-06-25 DIAGNOSIS — O24419 Gestational diabetes mellitus in pregnancy, unspecified control: Secondary | ICD-10-CM | POA: Insufficient documentation

## 2019-06-25 DIAGNOSIS — O0990 Supervision of high risk pregnancy, unspecified, unspecified trimester: Secondary | ICD-10-CM

## 2019-06-25 DIAGNOSIS — O358XX0 Maternal care for other (suspected) fetal abnormality and damage, not applicable or unspecified: Secondary | ICD-10-CM | POA: Insufficient documentation

## 2019-06-25 DIAGNOSIS — O283 Abnormal ultrasonic finding on antenatal screening of mother: Secondary | ICD-10-CM

## 2019-06-25 DIAGNOSIS — O4443 Low lying placenta NOS or without hemorrhage, third trimester: Secondary | ICD-10-CM

## 2019-06-25 DIAGNOSIS — Z362 Encounter for other antenatal screening follow-up: Secondary | ICD-10-CM

## 2019-06-25 DIAGNOSIS — O09523 Supervision of elderly multigravida, third trimester: Secondary | ICD-10-CM

## 2019-06-25 DIAGNOSIS — Z3A28 28 weeks gestation of pregnancy: Secondary | ICD-10-CM

## 2019-06-25 DIAGNOSIS — O09529 Supervision of elderly multigravida, unspecified trimester: Secondary | ICD-10-CM | POA: Insufficient documentation

## 2019-06-26 ENCOUNTER — Other Ambulatory Visit (HOSPITAL_COMMUNITY): Payer: Self-pay | Admitting: *Deleted

## 2019-06-26 DIAGNOSIS — O09523 Supervision of elderly multigravida, third trimester: Secondary | ICD-10-CM

## 2019-06-27 ENCOUNTER — Encounter: Payer: Self-pay | Attending: Obstetrics & Gynecology | Admitting: *Deleted

## 2019-06-27 ENCOUNTER — Other Ambulatory Visit: Payer: Self-pay

## 2019-06-27 ENCOUNTER — Ambulatory Visit: Payer: Self-pay | Admitting: *Deleted

## 2019-06-27 DIAGNOSIS — O283 Abnormal ultrasonic finding on antenatal screening of mother: Secondary | ICD-10-CM | POA: Insufficient documentation

## 2019-06-27 DIAGNOSIS — O24419 Gestational diabetes mellitus in pregnancy, unspecified control: Secondary | ICD-10-CM | POA: Insufficient documentation

## 2019-06-27 DIAGNOSIS — O0993 Supervision of high risk pregnancy, unspecified, third trimester: Secondary | ICD-10-CM | POA: Insufficient documentation

## 2019-06-27 DIAGNOSIS — O09523 Supervision of elderly multigravida, third trimester: Secondary | ICD-10-CM | POA: Insufficient documentation

## 2019-06-27 DIAGNOSIS — Z3A00 Weeks of gestation of pregnancy not specified: Secondary | ICD-10-CM | POA: Insufficient documentation

## 2019-06-27 NOTE — Progress Notes (Signed)
  Patient was seen on 06/27/2019 for Gestational Diabetes self-management. EDD 09/13/2019. Patient states no history of GDM but she does have a family history of Type 2 Diabetes. Diet history obtained. Patient eats good variety of all food groups. Beverages include water, sweet beverages such as lemonade and regular soda. She states she is very nervous about needles and is not sure if she can check her blood sugars. The following learning objectives were met by the patient :   States the definition of Gestational Diabetes  States why dietary management is important in controlling blood glucose  Describes the effects of carbohydrates on blood glucose levels  Demonstrates ability to create a balanced meal plan  Demonstrates carbohydrate counting   States when to check blood glucose levels  Demonstrates proper blood glucose monitoring techniques  States the effect of stress and exercise on blood glucose levels  States the importance of limiting caffeine and abstaining from alcohol and smoking  Plan:  Aim for 3 Carb Choices per meal (45 grams) +/- 1 either way  Aim for 1-2 Carbs per snack Begin reading food labels for Total Carbohydrate of foods If OK with your MD, consider  increasing your activity level by walking, Arm Chair Exercises or other activity daily as tolerated Begin checking BG before breakfast and 2 hours after first bite of breakfast, lunch and dinner as directed by MD  Bring Log Book/Sheet and meter to every medical appointment OR use Baby Scripts (see below) Baby Scripts:  Patient was introduced to [employer] and plans to use as record of BG electronically  Take medication if directed by MD  Blood glucose monitor given: [employer] Code Lot # [id number] Blood glucose reading: 120 mg/dl after lunch She was able to successfully prick her finger and test her BG in the office today!   Patient instructed to monitor glucose levels: FBS: 60 - 95 mg/dl 2 hour: <120  mg/dl  Patient received the following handouts:  Nutrition Diabetes and Pregnancy  Carbohydrate Counting List  BG Log Sheet  Patient will be seen for follow-up as needed.

## 2019-06-28 ENCOUNTER — Telehealth (INDEPENDENT_AMBULATORY_CARE_PROVIDER_SITE_OTHER): Payer: Self-pay | Admitting: Obstetrics & Gynecology

## 2019-06-28 DIAGNOSIS — O0993 Supervision of high risk pregnancy, unspecified, third trimester: Secondary | ICD-10-CM

## 2019-06-28 DIAGNOSIS — Z3A29 29 weeks gestation of pregnancy: Secondary | ICD-10-CM

## 2019-06-28 DIAGNOSIS — O0990 Supervision of high risk pregnancy, unspecified, unspecified trimester: Secondary | ICD-10-CM

## 2019-06-28 DIAGNOSIS — O09523 Supervision of elderly multigravida, third trimester: Secondary | ICD-10-CM

## 2019-06-28 DIAGNOSIS — O24419 Gestational diabetes mellitus in pregnancy, unspecified control: Secondary | ICD-10-CM

## 2019-06-28 NOTE — Progress Notes (Signed)
Asked pt if she had BP Cuff, she advised no one ever told her that she needed to have one or she would be sent one. Pt was a transfer from [place]. Pt is Self Pay so she will need to get one from our Office.Pt also wanted to know why the Dr, does not use a stethoscope on her at every office visit, like the other office did.

## 2019-06-28 NOTE — Progress Notes (Signed)
I connected with  Regina Cross on 06/28/19 at 11:15 AM EST by telephone and verified that I am speaking with the correct person using two identifiers.   I discussed the limitations, risks, security and privacy concerns of performing an evaluation and management service by telephone and the availability of in person appointments. I also discussed with the patient that there may be a patient responsible charge related to this service. The patient expressed understanding and agreed to proceed.  Bethanne Ginger, CMA 06/28/2019  11:15 AM

## 2019-06-28 NOTE — Patient Instructions (Signed)

## 2019-06-28 NOTE — Progress Notes (Signed)
   TELEHEALTH VIRTUAL OBSTETRICS VISIT ENCOUNTER NOTE  I connected with Regina Cross on 06/28/19 at 11:15 AM EST by telephone at home and verified that I am speaking with the correct person using two identifiers.   I discussed the limitations, risks, security and privacy concerns of performing an evaluation and management service by telephone and the availability of in person appointments. I also discussed with the patient that there may be a patient responsible charge related to this service. The patient expressed understanding and agreed to proceed.  Subjective:  Regina Cross is a 43 y.o. [id number] at [redacted]w[redacted]d being followed for ongoing prenatal care.  She is currently monitored for the following issues for this high-risk pregnancy and has Supervision of high risk pregnancy, antepartum; Abnormal fetal ultrasound; AMA (advanced maternal age) multigravida 35+; Patient noncompliance; Marginal placenta previa; and Gestational diabetes mellitus (GDM) affecting pregnancy on their problem list.  Patient reports no complaints. Reports fetal movement. Denies any contractions, bleeding or leaking of fluid.   The following portions of the patient's history were reviewed and updated as appropriate: allergies, current medications, past family history, past medical history, past social history, past surgical history and problem list.   Objective:   General:  Alert, oriented and cooperative.   Mental Status: Normal mood and affect perceived. Normal judgment and thought content.  Rest of physical exam deferred due to type of encounter GDM, FBS 100, just started testing, will return in 1 week to consider adding medication  Assessment and Plan:  Pregnancy: [id number] at [redacted]w[redacted]d There are no diagnoses linked to this encounter. Preterm labor symptoms and general obstetric precautions including but not limited to vaginal bleeding, contractions, leaking of fluid and fetal movement were reviewed in detail with the  patient.  I discussed the assessment and treatment plan with the patient. The patient was provided an opportunity to ask questions and all were answered. The patient agreed with the plan and demonstrated an understanding of the instructions. The patient was advised to call back or seek an in-person office evaluation/go to [facility] at [facility] for any urgent or concerning symptoms. Please refer to After Visit Summary for other counseling recommendations.   I provided 12 minutes of non-face-to-face time during this encounter.  Return in about 1 week (around 07/05/2019) for virtual.  Future Appointments  Date Time Provider [place]  07/27/2019  3:15 PM [place] [facility]  07/27/2019  3:15 PM [place]    Emeterio Reeve, [place] for [place], [place]

## 2019-06-29 ENCOUNTER — Other Ambulatory Visit: Payer: Self-pay

## 2019-07-10 ENCOUNTER — Other Ambulatory Visit: Payer: Self-pay

## 2019-07-10 ENCOUNTER — Telehealth (INDEPENDENT_AMBULATORY_CARE_PROVIDER_SITE_OTHER): Payer: Self-pay | Admitting: Family Medicine

## 2019-07-10 DIAGNOSIS — O0993 Supervision of high risk pregnancy, unspecified, third trimester: Secondary | ICD-10-CM

## 2019-07-10 DIAGNOSIS — O283 Abnormal ultrasonic finding on antenatal screening of mother: Secondary | ICD-10-CM

## 2019-07-10 DIAGNOSIS — Z3A30 30 weeks gestation of pregnancy: Secondary | ICD-10-CM

## 2019-07-10 DIAGNOSIS — O0990 Supervision of high risk pregnancy, unspecified, unspecified trimester: Secondary | ICD-10-CM

## 2019-07-10 DIAGNOSIS — O24419 Gestational diabetes mellitus in pregnancy, unspecified control: Secondary | ICD-10-CM

## 2019-07-10 DIAGNOSIS — O09523 Supervision of elderly multigravida, third trimester: Secondary | ICD-10-CM

## 2019-07-10 MED ORDER — FERROUS SULFATE 324 (65 FE) MG PO TBEC: 1.0000 | DELAYED_RELEASE_TABLET | ORAL | 2 refills | Status: DC

## 2019-07-10 NOTE — Progress Notes (Signed)
@  1110am called no answer. Will attempt again @1122am  LVM no answer advised that id call back in 5 mins  I connected with  [name] on 07/10/19 at 11:15 AM EST by telephone and verified that I am speaking with the correct person using two identifiers.   I discussed the limitations, risks, security and privacy concerns of performing an evaluation and management service by telephone and the availability of in person appointments. I also discussed with the patient that there may be a patient responsible charge related to this service. The patient expressed understanding and agreed to proceed.  Alric Seton, [place] 07/10/2019  11:34 AM   Babyscripts will get done today. Blood Sugars No blood pressure cuff

## 2019-07-10 NOTE — Progress Notes (Signed)
I connected with@ on 07/10/19 at 11:15 AM EST by: telephone and verified that I am speaking with the correct person using two identifiers.  Patient is located at home and provider is located at [facility] office.     The purpose of this virtual visit is to provide medical care while limiting exposure to the novel coronavirus. I discussed the limitations, risks, security and privacy concerns of performing an evaluation and management service by telephone and the availability of in person appointments. I also discussed with the patient that there may be a patient responsible charge related to this service. By engaging in this virtual visit, you consent to the provision of healthcare.  Additionally, you authorize for your insurance to be billed for the services provided during this visit.  The patient expressed understanding and agreed to proceed.  The following staff members participated in the virtual visit:  Regina Cross    PRENATAL VISIT NOTE  Subjective:  Regina Cross is a 43 y.o. [id number] at [redacted]w[redacted]d  for phone visit for ongoing prenatal care.  She is currently monitored for the following issues for this high-risk pregnancy and has Supervision of high risk pregnancy, antepartum; Abnormal fetal ultrasound; AMA (advanced maternal age) multigravida 35+; Patient noncompliance; Marginal placenta previa; and Gestational diabetes mellitus (GDM) affecting pregnancy on their problem list.  Patient reports no complaints.  Contractions: Irritability. Vag. Bleeding: None.  Movement: Present. Denies leaking of fluid.   The following portions of the patient's history were reviewed and updated as appropriate: allergies, current medications, past family history, past medical history, past social history, past surgical history and problem list.   Objective:  There were no vitals filed for this visit. Self-Obtained  Fetal Status:     Movement: Present     Assessment and Plan:  Pregnancy: [id number] at [redacted]w[redacted]d  1. Gestational diabetes mellitus (GDM) affecting pregnancy Patient having difficulty with babyscripts app but able to tell me her numbers for past six days over the phone (2hr PP): Fasting: 86,67,77,78,82,86 Breakfast: 92,100,98,96,110,109 Lunch: 117,112,96,100,107,109 Dinner: 92,110,108,97,107,100,103 No abnormal values, congratulated on excellent control of GDM Should have babyscripts issues resolved later today  2. Abnormal fetal ultrasound Cystic hygroma seen on first trimester [place] 05/02/2019 w Dr. Donalee Citrin, counseled on finding and possible association w genetic abnormalities (low risk panorama this pregnancy), structural heart defects (more commonly associated), and norma fetus [place] 05/28/2019 normal, declined amniocentesis Sent for fetal echo on 06/07/2019 which was essentially normal but some views c/f possible defect though likely artifact, scheduled for repeat on 07/25/2019  3. Supervision of high risk pregnancy, antepartum Anemia noted on 3rd tri labs, iron rx sent Discuss contraception next visit  4. Multigravida of advanced maternal age in third trimester Connected w antenatal testing Follow up growth scan scheduled for 07/27/2019 Per last MFM [place] on 06/25/2019 they will start weekly antenatal testing on that date at 58 weeks gesetation  Preterm labor symptoms and general obstetric precautions including but not limited to vaginal bleeding, contractions, leaking of fluid and fetal movement were reviewed in detail with the patient.  Return in about 2 weeks (around 07/24/2019) for [facility], needs MD, in person.  Future Appointments  Date Time Provider [place]  07/27/2019 11:15 AM Aletha Halim, MD [place]  07/27/2019  3:15 PM [place] NURSE [place] [facility]  07/27/2019  3:15 PM [place] 4 WH-[facility]     Time spent on virtual visit: 18 minutes  Regina Flock, MD

## 2019-07-21 ENCOUNTER — Other Ambulatory Visit: Payer: Self-pay

## 2019-07-21 ENCOUNTER — Encounter: Payer: Self-pay | Admitting: Family Medicine

## 2019-07-21 DIAGNOSIS — Z3490 Encounter for supervision of normal pregnancy, unspecified, unspecified trimester: Secondary | ICD-10-CM

## 2019-07-23 MED ORDER — ONE-A-DAY WOMENS PRENATAL 1 28-0.8-235 MG PO CAPS: 1.0000 | ORAL_CAPSULE | Freq: Every day | ORAL | 0 refills | Status: DC

## 2019-07-27 ENCOUNTER — Ambulatory Visit (HOSPITAL_COMMUNITY): Payer: Self-pay | Admitting: *Deleted

## 2019-07-27 ENCOUNTER — Ambulatory Visit (INDEPENDENT_AMBULATORY_CARE_PROVIDER_SITE_OTHER): Payer: Self-pay | Admitting: Obstetrics and Gynecology

## 2019-07-27 ENCOUNTER — Encounter (HOSPITAL_COMMUNITY): Payer: Self-pay

## 2019-07-27 ENCOUNTER — Ambulatory Visit (HOSPITAL_COMMUNITY)
Admission: RE | Admit: 2019-07-27 | Discharge: 2019-07-27 | Disposition: A | Discharge status: Home / Self Care | Payer: Self-pay | Source: Ambulatory Visit | Attending: Obstetrics and Gynecology | Admitting: Obstetrics and Gynecology

## 2019-07-27 ENCOUNTER — Other Ambulatory Visit: Payer: Self-pay

## 2019-07-27 VITALS — BP 122/76 | HR 92 | Wt 154.3 lb

## 2019-07-27 DIAGNOSIS — O0990 Supervision of high risk pregnancy, unspecified, unspecified trimester: Secondary | ICD-10-CM | POA: Insufficient documentation

## 2019-07-27 DIAGNOSIS — O24419 Gestational diabetes mellitus in pregnancy, unspecified control: Secondary | ICD-10-CM

## 2019-07-27 DIAGNOSIS — O0993 Supervision of high risk pregnancy, unspecified, third trimester: Secondary | ICD-10-CM

## 2019-07-27 DIAGNOSIS — O4443 Low lying placenta NOS or without hemorrhage, third trimester: Secondary | ICD-10-CM

## 2019-07-27 DIAGNOSIS — Z3A33 33 weeks gestation of pregnancy: Secondary | ICD-10-CM

## 2019-07-27 DIAGNOSIS — O283 Abnormal ultrasonic finding on antenatal screening of mother: Secondary | ICD-10-CM | POA: Insufficient documentation

## 2019-07-27 DIAGNOSIS — Z362 Encounter for other antenatal screening follow-up: Secondary | ICD-10-CM

## 2019-07-27 DIAGNOSIS — O09523 Supervision of elderly multigravida, third trimester: Secondary | ICD-10-CM

## 2019-07-27 DIAGNOSIS — O359XX0 Maternal care for (suspected) fetal abnormality and damage, unspecified, not applicable or unspecified: Secondary | ICD-10-CM

## 2019-07-31 NOTE — Progress Notes (Signed)
Prenatal Visit Note Date: 07/27/2019 Clinic: [facility]  Subjective:  Regina Cross is a 43 y.o. K4M0102 at [redacted]w[redacted]d being seen today for ongoing prenatal care.  She is currently monitored for the following issues for this high-risk pregnancy and has Supervision of high risk pregnancy, antepartum; Abnormal fetal ultrasound; AMA (advanced maternal age) multigravida 35+; Patient noncompliance; and Gestational diabetes mellitus (GDM) affecting pregnancy on their problem list.  Patient reports no complaints.   Contractions: Irritability. Vag. Bleeding: None.  Movement: Present. Denies leaking of fluid.   The following portions of the patient's history were reviewed and updated as appropriate: allergies, current medications, past family history, past medical history, past social history, past surgical history and problem list. Problem list updated.  Objective:   Vitals:   07/27/19 1157  BP: 122/76  Pulse: 92  Weight: 154 lb 4.8 oz (70 kg)    Fetal Status: Fetal Heart Rate (bpm): 141   Movement: Present     General:  Alert, oriented and cooperative. Patient is in no acute distress.  Skin: Skin is warm and dry. No rash noted.   Cardiovascular: Normal heart rate noted  Respiratory: Normal respiratory effort, no problems with respiration noted  Abdomen: Soft, gravid, appropriate for gestational age. Pain/Pressure: Present     Pelvic:  Cervical exam deferred        Extremities: Normal range of motion.     Mental Status: Normal mood and affect. Normal behavior. Normal judgment and thought content.   Urinalysis:      Assessment and Plan:  Pregnancy: [id number] at [redacted]w[redacted]d  1. Multigravida of advanced maternal age in third trimester Continue ap testing - [place] MFM FETAL BPP W/NONSTRESS; Future  2. Supervision of high risk pregnancy, antepartum Routine care.  Recommend delivery at 39wks Patient doesn't want BTL U/s today normal with afi 12, efw 15%, ac 49%, bpp 8/8 - [place]  MFM FETAL BPP W/NONSTRESS; Future  3. Gestational diabetes mellitus (GDM) affecting pregnancy Normal BS log just need more values. Pt on no meds see. See above Pt states she missed her 12/9 rpt fetal echo appt. RN to try and set up rpt  - [place] MFM FETAL BPP W/NONSTRESS; Future  Preterm labor symptoms and general obstetric precautions including but not limited to vaginal bleeding, contractions, leaking of fluid and fetal movement were reviewed in detail with the patient. Please refer to After Visit Summary for other counseling recommendations.  Return in about 1 week (around 08/03/2019) for high risk, nst/bpp with mfm.   Aletha Halim, MD

## 2019-08-01 ENCOUNTER — Ambulatory Visit (INDEPENDENT_AMBULATORY_CARE_PROVIDER_SITE_OTHER): Payer: Self-pay | Admitting: Obstetrics and Gynecology

## 2019-08-01 ENCOUNTER — Other Ambulatory Visit: Payer: Self-pay

## 2019-08-01 VITALS — BP 133/87 | HR 80 | Wt 156.0 lb

## 2019-08-01 DIAGNOSIS — O0990 Supervision of high risk pregnancy, unspecified, unspecified trimester: Secondary | ICD-10-CM

## 2019-08-01 DIAGNOSIS — O09523 Supervision of elderly multigravida, third trimester: Secondary | ICD-10-CM

## 2019-08-01 DIAGNOSIS — Z3A33 33 weeks gestation of pregnancy: Secondary | ICD-10-CM

## 2019-08-01 DIAGNOSIS — O24419 Gestational diabetes mellitus in pregnancy, unspecified control: Secondary | ICD-10-CM

## 2019-08-01 DIAGNOSIS — O0993 Supervision of high risk pregnancy, unspecified, third trimester: Secondary | ICD-10-CM

## 2019-08-01 DIAGNOSIS — O133 Gestational [pregnancy-induced] hypertension without significant proteinuria, third trimester: Secondary | ICD-10-CM

## 2019-08-01 NOTE — Progress Notes (Signed)
BC Pills / Condoms no BTL

## 2019-08-01 NOTE — Progress Notes (Signed)
Prenatal Visit Note Date: 08/01/2019 Clinic: [facility]  Subjective:  Regina Cross is a 43 y.o. [id number] at [redacted]w[redacted]d being seen today for ongoing prenatal care.  She is currently monitored for the following issues for this high-risk pregnancy and has Supervision of high risk pregnancy, antepartum; Abnormal fetal ultrasound; AMA (advanced maternal age) multigravida 35+; Patient noncompliance; and Gestational diabetes mellitus (GDM) affecting pregnancy on their problem list.  Patient reports no complaints.   Contractions: Irritability. Vag. Bleeding: None.  Movement: Present. Denies leaking of fluid.   The following portions of the patient's history were reviewed and updated as appropriate: allergies, current medications, past family history, past medical history, past social history, past surgical history and problem list. Problem list updated.  Objective:   Vitals:   08/01/19 1052 08/01/19 1104  BP: (!) 118/94 133/87  Pulse: 78 80  Weight: 156 lb (70.8 kg)     Fetal Status: Fetal Heart Rate (bpm): 156   Movement: Present     General:  Alert, oriented and cooperative. Patient is in no acute distress.  Skin: Skin is warm and dry. No rash noted.   Cardiovascular: Normal heart rate noted  Respiratory: Normal respiratory effort, no problems with respiration noted  Abdomen: Soft, gravid, appropriate for gestational age. Pain/Pressure: Present     Pelvic:  Cervical exam deferred        Extremities: Normal range of motion.  Edema: None  Mental Status: Normal mood and affect. Normal behavior. Normal judgment and thought content.   Urinalysis:      Assessment and Plan:  Pregnancy: [id number] at [redacted]w[redacted]d  1. Gestational diabetes mellitus (GDM) affecting pregnancy Normal numbers but still scant data; pt forgot log book Continue with a1 control.  Has rpt fetal echo for january  2. Multigravida of advanced maternal age in third trimester  3. Supervision of high risk  pregnancy, antepartum Ap testing already scheduled by mfm. Normal bpp, efw last week No s/s of pre-eclampsia OCPs, condoms  Preterm labor symptoms and general obstetric precautions including but not limited to vaginal bleeding, contractions, leaking of fluid and fetal movement were reviewed in detail with the patient. Please refer to After Visit Summary for other counseling recommendations.  Return in about 2 weeks (around 08/15/2019) for high risk, in person.   Aletha Halim, MD

## 2019-08-08 ENCOUNTER — Other Ambulatory Visit: Payer: Self-pay | Admitting: Lactation Services

## 2019-08-08 MED ORDER — ASPIRIN EC 81 MG PO TBEC: 81.0000 mg | DELAYED_RELEASE_TABLET | Freq: Every day | ORAL | 3 refills | Status: DC

## 2019-08-15 ENCOUNTER — Ambulatory Visit (HOSPITAL_COMMUNITY): Payer: Self-pay | Admitting: *Deleted

## 2019-08-15 ENCOUNTER — Ambulatory Visit (HOSPITAL_COMMUNITY)
Admission: RE | Admit: 2019-08-15 | Discharge: 2019-08-15 | Disposition: A | Discharge status: Home / Self Care | Payer: Self-pay | Source: Ambulatory Visit | Attending: Obstetrics and Gynecology | Admitting: Obstetrics and Gynecology

## 2019-08-15 ENCOUNTER — Encounter (HOSPITAL_COMMUNITY): Payer: Self-pay

## 2019-08-15 ENCOUNTER — Other Ambulatory Visit: Payer: Self-pay

## 2019-08-15 ENCOUNTER — Ambulatory Visit (INDEPENDENT_AMBULATORY_CARE_PROVIDER_SITE_OTHER): Payer: Self-pay | Admitting: Obstetrics and Gynecology

## 2019-08-15 ENCOUNTER — Ambulatory Visit (HOSPITAL_COMMUNITY): Admission: RE | Admit: 2019-08-15 | Payer: Self-pay | Source: Ambulatory Visit

## 2019-08-15 VITALS — BP 132/82 | HR 72 | Wt 151.6 lb

## 2019-08-15 DIAGNOSIS — O24419 Gestational diabetes mellitus in pregnancy, unspecified control: Secondary | ICD-10-CM

## 2019-08-15 DIAGNOSIS — O09523 Supervision of elderly multigravida, third trimester: Secondary | ICD-10-CM

## 2019-08-15 DIAGNOSIS — O133 Gestational [pregnancy-induced] hypertension without significant proteinuria, third trimester: Secondary | ICD-10-CM

## 2019-08-15 DIAGNOSIS — O0993 Supervision of high risk pregnancy, unspecified, third trimester: Secondary | ICD-10-CM

## 2019-08-15 DIAGNOSIS — Z3A35 35 weeks gestation of pregnancy: Secondary | ICD-10-CM

## 2019-08-15 DIAGNOSIS — O359XX0 Maternal care for (suspected) fetal abnormality and damage, unspecified, not applicable or unspecified: Secondary | ICD-10-CM

## 2019-08-15 DIAGNOSIS — O283 Abnormal ultrasonic finding on antenatal screening of mother: Secondary | ICD-10-CM | POA: Insufficient documentation

## 2019-08-15 DIAGNOSIS — O0990 Supervision of high risk pregnancy, unspecified, unspecified trimester: Secondary | ICD-10-CM | POA: Insufficient documentation

## 2019-08-15 DIAGNOSIS — Z113 Encounter for screening for infections with a predominantly sexual mode of transmission: Secondary | ICD-10-CM

## 2019-08-15 DIAGNOSIS — O4443 Low lying placenta NOS or without hemorrhage, third trimester: Secondary | ICD-10-CM

## 2019-08-15 NOTE — Procedures (Signed)
Regina Cross February 14, 1976 [redacted]w[redacted]d  Fetus A Non-Stress Test Interpretation for 08/15/19  Indication: Advanced Maternal Age >40 years  Fetal Heart Rate A Mode: External Baseline Rate (A): 135 bpm Variability: Moderate Accelerations: 15 x 15 Decelerations: None Multiple birth?: No  Uterine Activity Mode: Palpation, Toco Contraction Frequency (min): x1 with U/I Contraction Duration (sec): 40-120 Contraction Quality: Mild Resting Tone Palpated: Relaxed Resting Time: Adequate  Interpretation (Fetal Testing) Nonstress Test Interpretation: Reactive Comments: Reviewed tracing with Dr. Annamaria Boots

## 2019-08-15 NOTE — Progress Notes (Signed)
Prenatal Visit Note Date: 08/15/2019 Clinic: [facility]  Subjective:  Regina Cross is a 43 y.o. E4M3536 at [redacted]w[redacted]d being seen today for ongoing prenatal care.  She is currently monitored for the following issues for this high-risk pregnancy and has Supervision of high risk pregnancy, antepartum; Abnormal fetal ultrasound; AMA (advanced maternal age) multigravida 27+; Patient noncompliance; Gestational diabetes mellitus (GDM) affecting pregnancy; and Transient hypertension of pregnancy in third trimester on their problem list.  Patient reports no complaints.   Contractions: Irritability. Vag. Bleeding: None.  Movement: Present. Denies leaking of fluid.   The following portions of the patient's history were reviewed and updated as appropriate: allergies, current medications, past family history, past medical history, past social history, past surgical history and problem list. Problem list updated.  Objective:   Vitals:   08/15/19 1035  BP: 132/82  Pulse: 72  Weight: 151 lb 9.6 oz (68.8 kg)    Fetal Status: Fetal Heart Rate (bpm): 152   Movement: Present     General:  Alert, oriented and cooperative. Patient is in no acute distress.  Skin: Skin is warm and dry. No rash noted.   Cardiovascular: Normal heart rate noted  Respiratory: Normal respiratory effort, no problems with respiration noted  Abdomen: Soft, gravid, appropriate for gestational age. Pain/Pressure: Absent     Pelvic:  Cervical exam performed Dilation: Fingertip Effacement (%): Thick Station: Ballotable  Extremities: Normal range of motion.  Edema: Trace  Mental Status: Normal mood and affect. Normal behavior. Normal judgment and thought content.   Urinalysis:      Assessment and Plan:  Pregnancy: [id number] at [redacted]w[redacted]d  1. Supervision of high risk pregnancy, antepartum Routine care. OCPs, condoms. Pt is self pay. Watch maternal weight - Culture, beta strep (group b only) - GC/Chlamydia probe amp  ([place])not at [facility]  2. Transient hypertension of pregnancy in third trimester Normal today  3. Gestational diabetes mellitus (GDM) affecting pregnancy CBGs look good but I told her we need more numbers for better tracking bpp 8/8, continue with qwk mfm testing 12/11: efw 15%, ac 49% Delivery at 39wks  4. Multigravida of advanced maternal age in third trimester  5. Abnormal fetal ultrasound Has rpt fetal echo on 1/4  Preterm labor symptoms and general obstetric precautions including but not limited to vaginal bleeding, contractions, leaking of fluid and fetal movement were reviewed in detail with the patient. Please refer to After Visit Summary for other counseling recommendations.  Return in about 9 days (around 08/24/2019) for in person, high risk.   Aletha Halim, MD

## 2019-08-15 NOTE — Progress Notes (Signed)
     Laurabelle Gorczyca RN 08/15/19 

## 2019-08-16 LAB — GC/CHLAMYDIA PROBE AMP (~~LOC~~) NOT AT ARMC
Chlamydia: NEGATIVE
Comment: NEGATIVE
Comment: NORMAL
Neisseria Gonorrhea: NEGATIVE

## 2019-08-17 NOTE — L&D Delivery Note (Addendum)
OB/GYN Faculty Practice Delivery Note  Regina Cross is a 44 y.o. [id number] s/p SVD at [redacted]w[redacted]d. She was admitted for SOL w/ SROM at 1730 on 1/29.   ROM: 11h 48m with clear fluid GBS Status: Negative/-- (12/30 1106) Maximum Maternal Temperature: 98.6 F  Labor Progress: Patient presented to L&D for SOL after having SROM's clear fluid at 1730. Initial SVE: 2/70/-2. Labor course was complicated variable heart rate and intermittent decelerations. Position changes were made which improved these findings. An FSE and IUPC were placed for monitoring. She then progressed to complete.   Delivery Date/Time: 09/15/2019 @ 0443 Delivery: Called to room and patient was complete and pushing. Baby continued to have intermittent bradycardia during second stage labor, and during pushing. Bradycardia improved with head descent to +3. Head position was ROA and delivered with ease over the perineum. No nuchal cord present. Shoulder and body delivered in usual fashion. Infant with spontaneous cry, placed on mother's abdomen, dried and stimulated. Cord clamped x 2 after 1-minute delay, and cut by Sheryn Bison, RN. Cord blood drawn. Placenta delivered spontaneously with gentle cord traction. Fundus firm with massage and pitocin started. Labia, perineum, vagina, and cervix inspected and was clear of lacerations.  Baby Weight: pending  Cord: marginal insertion, 3 vessel Placenta: Sent to L&D Complications: None Lacerations: None EBL: 200cc Analgesia: Epidural   Infant:  APGAR (1 MIN): 8 APGAR (5 MINS): 9   Lorri Frederick, DO, PGY-1 OBGYN Faculty Teaching Service  09/15/2019, 4:55 AM  OB FELLOW DELIVERY ATTESTATION  I was gloved and present for the delivery in its entirety, and I agree with the above resident's note. NICU present for delivery due to abnormal FHT intrapartum. Infant to room-in with mother.   Jerilynn Birkenhead, MD [facility] Family Medicine Fellow, [facility] for [employer], [facility]

## 2019-08-19 LAB — CULTURE, BETA STREP (GROUP B ONLY): Strep Gp B Culture: NEGATIVE

## 2019-08-20 ENCOUNTER — Other Ambulatory Visit: Payer: Self-pay | Admitting: *Deleted

## 2019-08-20 DIAGNOSIS — O0990 Supervision of high risk pregnancy, unspecified, unspecified trimester: Secondary | ICD-10-CM

## 2019-08-20 MED ORDER — PRENATAL 27-0.8 MG PO TABS: 1.0000 | ORAL_TABLET | Freq: Every day | ORAL | 6 refills | Status: DC

## 2019-08-23 ENCOUNTER — Encounter: Payer: Self-pay | Admitting: Obstetrics and Gynecology

## 2019-08-23 ENCOUNTER — Ambulatory Visit (INDEPENDENT_AMBULATORY_CARE_PROVIDER_SITE_OTHER): Payer: Self-pay | Admitting: Obstetrics and Gynecology

## 2019-08-23 ENCOUNTER — Other Ambulatory Visit: Payer: Self-pay

## 2019-08-23 VITALS — BP 127/78 | HR 86 | Wt 153.0 lb

## 2019-08-23 DIAGNOSIS — O0993 Supervision of high risk pregnancy, unspecified, third trimester: Secondary | ICD-10-CM

## 2019-08-23 DIAGNOSIS — Z3A37 37 weeks gestation of pregnancy: Secondary | ICD-10-CM

## 2019-08-23 DIAGNOSIS — O0990 Supervision of high risk pregnancy, unspecified, unspecified trimester: Secondary | ICD-10-CM

## 2019-08-23 DIAGNOSIS — O133 Gestational [pregnancy-induced] hypertension without significant proteinuria, third trimester: Secondary | ICD-10-CM

## 2019-08-23 DIAGNOSIS — O24419 Gestational diabetes mellitus in pregnancy, unspecified control: Secondary | ICD-10-CM

## 2019-08-23 DIAGNOSIS — O09523 Supervision of elderly multigravida, third trimester: Secondary | ICD-10-CM

## 2019-08-23 NOTE — Patient Instructions (Signed)
Third Trimester of Pregnancy The third trimester is from week 28 through week 40 (months 7 through 9). The third trimester is a time when the unborn baby (fetus) is growing rapidly. At the end of the ninth month, the fetus is about 20 inches in length and weighs 6-10 pounds. Body changes during your third trimester Your body will continue to go through many changes during pregnancy. The changes vary from woman to woman. During the third trimester:  Your weight will continue to increase. You can expect to gain 25-35 pounds (11-16 kg) by the end of the pregnancy.  You may begin to get stretch marks on your hips, abdomen, and breasts.  You may urinate more often because the fetus is moving lower into your pelvis and pressing on your bladder.  You may develop or continue to have heartburn. This is caused by increased hormones that slow down muscles in the digestive tract.  You may develop or continue to have constipation because increased hormones slow digestion and cause the muscles that push waste through your intestines to relax.  You may develop hemorrhoids. These are swollen veins (varicose veins) in the rectum that can itch or be painful.  You may develop swollen, bulging veins (varicose veins) in your legs.  You may have increased body aches in the pelvis, back, or thighs. This is due to weight gain and increased hormones that are relaxing your joints.  You may have changes in your hair. These can include thickening of your hair, rapid growth, and changes in texture. Some women also have hair loss during or after pregnancy, or hair that feels dry or thin. Your hair will most likely return to normal after your baby is born.  Your breasts will continue to grow and they will continue to become tender. A yellow fluid (colostrum) may leak from your breasts. This is the first milk you are producing for your baby.  Your belly button may stick out.  You may notice more swelling in your hands,  face, or ankles.  You may have increased tingling or numbness in your hands, arms, and legs. The skin on your belly may also feel numb.  You may feel short of breath because of your expanding uterus.  You may have more problems sleeping. This can be caused by the size of your belly, increased need to urinate, and an increase in your body's metabolism.  You may notice the fetus "dropping," or moving lower in your abdomen (lightening).  You may have increased vaginal discharge.  You may notice your joints feel loose and you may have pain around your pelvic bone. What to expect at prenatal visits You will have prenatal exams every 2 weeks until week 36. Then you will have weekly prenatal exams. During a routine prenatal visit:  You will be weighed to make sure you and the baby are growing normally.  Your blood pressure will be taken.  Your abdomen will be measured to track your baby's growth.  The fetal heartbeat will be listened to.  Any test results from the previous visit will be discussed.  You may have a cervical check near your due date to see if your cervix has softened or thinned (effaced).  You will be tested for Group B streptococcus. This happens between 35 and 37 weeks. Your health care provider may ask you:  What your birth plan is.  How you are feeling.  If you are feeling the baby move.  If you have had any abnormal   symptoms, such as leaking fluid, bleeding, severe headaches, or abdominal cramping.  If you are using any tobacco products, including cigarettes, chewing tobacco, and electronic cigarettes.  If you have any questions. Other tests or screenings that may be performed during your third trimester include:  Blood tests that check for low iron levels (anemia).  Fetal testing to check the health, activity level, and growth of the fetus. Testing is done if you have certain medical conditions or if there are problems during the pregnancy.  Nonstress test  (NST). This test checks the health of your baby to make sure there are no signs of problems, such as the baby not getting enough oxygen. During this test, a belt is placed around your belly. The baby is made to move, and its heart rate is monitored during movement. What is false labor? False labor is a condition in which you feel small, irregular tightenings of the muscles in the womb (contractions) that usually go away with rest, changing position, or drinking water. These are called Braxton Hicks contractions. Contractions may last for hours, days, or even weeks before true labor sets in. If contractions come at regular intervals, become more frequent, increase in intensity, or become painful, you should see your health care provider. What are the signs of labor?  Abdominal cramps.  Regular contractions that start at 10 minutes apart and become stronger and more frequent with time.  Contractions that start on the top of the uterus and spread down to the lower abdomen and back.  Increased pelvic pressure and dull back pain.  A watery or bloody mucus discharge that comes from the vagina.  Leaking of amniotic fluid. This is also known as your "water breaking." It could be a slow trickle or a gush. Let your health care provider know if it has a color or strange odor. If you have any of these signs, call your health care provider right away, even if it is before your due date. Follow these instructions at home: Medicines  Follow your health care provider's instructions regarding medicine use. Specific medicines may be either safe or unsafe to take during pregnancy.  Take a prenatal vitamin that contains at least 600 micrograms (mcg) of folic acid.  If you develop constipation, try taking a stool softener if your health care provider approves. Eating and drinking   Eat a balanced diet that includes fresh fruits and vegetables, whole grains, good sources of protein such as meat, eggs, or tofu,  and low-fat dairy. Your health care provider will help you determine the amount of weight gain that is right for you.  Avoid raw meat and uncooked cheese. These carry germs that can cause birth defects in the baby.  If you have low calcium intake from food, talk to your health care provider about whether you should take a daily calcium supplement.  Eat four or five small meals rather than three large meals a day.  Limit foods that are high in fat and processed sugars, such as fried and sweet foods.  To prevent constipation: ? Drink enough fluid to keep your urine clear or pale yellow. ? Eat foods that are high in fiber, such as fresh fruits and vegetables, whole grains, and beans. Activity  Exercise only as directed by your health care provider. Most women can continue their usual exercise routine during pregnancy. Try to exercise for 30 minutes at least 5 days a week. Stop exercising if you experience uterine contractions.  Avoid heavy lifting.  Do   not exercise in extreme heat or humidity, or at high altitudes.  Wear low-heel, comfortable shoes.  Practice good posture.  You may continue to have sex unless your health care provider tells you otherwise. Relieving pain and discomfort  Take frequent breaks and rest with your legs elevated if you have leg cramps or low back pain.  Take warm sitz baths to soothe any pain or discomfort caused by hemorrhoids. Use hemorrhoid cream if your health care provider approves.  Wear a good support bra to prevent discomfort from breast tenderness.  If you develop varicose veins: ? Wear support pantyhose or compression stockings as told by your healthcare provider. ? Elevate your feet for 15 minutes, 3-4 times a day. Prenatal care  Write down your questions. Take them to your prenatal visits.  Keep all your prenatal visits as told by your health care provider. This is important. Safety  Wear your seat belt at all times when driving.  Make  a list of emergency phone numbers, including numbers for family, friends, the hospital, and police and fire departments. General instructions  Avoid cat litter boxes and soil used by cats. These carry germs that can cause birth defects in the baby. If you have a cat, ask someone to clean the litter box for you.  Do not travel far distances unless it is absolutely necessary and only with the approval of your health care provider.  Do not use hot tubs, steam rooms, or saunas.  Do not drink alcohol.  Do not use any products that contain nicotine or tobacco, such as cigarettes and e-cigarettes. If you need help quitting, ask your health care provider.  Do not use any medicinal herbs or unprescribed drugs. These chemicals affect the formation and growth of the baby.  Do not douche or use tampons or scented sanitary pads.  Do not cross your legs for long periods of time.  To prepare for the arrival of your baby: ? Take prenatal classes to understand, practice, and ask questions about labor and delivery. ? Make a trial run to the hospital. ? Visit the hospital and tour the maternity area. ? Arrange for maternity or paternity leave through employers. ? Arrange for family and friends to take care of pets while you are in the hospital. ? Purchase a rear-facing car seat and make sure you know how to install it in your car. ? Pack your hospital bag. ? Prepare the baby's nursery. Make sure to remove all pillows and stuffed animals from the baby's crib to prevent suffocation.  Visit your dentist if you have not gone during your pregnancy. Use a soft toothbrush to brush your teeth and be gentle when you floss. Contact a health care provider if:  You are unsure if you are in labor or if your water has broken.  You become dizzy.  You have mild pelvic cramps, pelvic pressure, or nagging pain in your abdominal area.  You have lower back pain.  You have persistent nausea, vomiting, or  diarrhea.  You have an unusual or bad smelling vaginal discharge.  You have pain when you urinate. Get help right away if:  Your water breaks before 37 weeks.  You have regular contractions less than 5 minutes apart before 37 weeks.  You have a fever.  You are leaking fluid from your vagina.  You have spotting or bleeding from your vagina.  You have severe abdominal pain or cramping.  You have rapid weight loss or weight gain.  You have   shortness of breath with chest pain.  You notice sudden or extreme swelling of your face, hands, ankles, feet, or legs.  Your baby makes fewer than 10 movements in 2 hours.  You have severe headaches that do not go away when you take medicine.  You have vision changes. Summary  The third trimester is from week 28 through week 40, months 7 through 9. The third trimester is a time when the unborn baby (fetus) is growing rapidly.  During the third trimester, your discomfort may increase as you and your baby continue to gain weight. You may have abdominal, leg, and back pain, sleeping problems, and an increased need to urinate.  During the third trimester your breasts will keep growing and they will continue to become tender. A yellow fluid (colostrum) may leak from your breasts. This is the first milk you are producing for your baby.  False labor is a condition in which you feel small, irregular tightenings of the muscles in the womb (contractions) that eventually go away. These are called Braxton Hicks contractions. Contractions may last for hours, days, or even weeks before true labor sets in.  Signs of labor can include: abdominal cramps; regular contractions that start at 10 minutes apart and become stronger and more frequent with time; watery or bloody mucus discharge that comes from the vagina; increased pelvic pressure and dull back pain; and leaking of amniotic fluid. This information is not intended to replace advice given to you by your  health care provider. Make sure you discuss any questions you have with your health care provider. Document Revised: 11/23/2018 Document Reviewed: 09/07/2016 Elsevier Patient Education  2020 [employer]  

## 2019-08-23 NOTE — Progress Notes (Signed)
Subjective:  Regina Cross is a 44 y.o. [phone] at [redacted]w[redacted]d being seen today for ongoing prenatal care.  She is currently monitored for the following issues for this high-risk pregnancy and has Supervision of high risk pregnancy, antepartum; Abnormal fetal ultrasound; AMA (advanced maternal age) multigravida 35+; Patient noncompliance; Gestational diabetes mellitus (GDM) affecting pregnancy; and Transient hypertension of pregnancy in third trimester on their problem list.  Patient reports general discomforts of pregnancy.  Contractions: Irritability. Vag. Bleeding: None.  Movement: Present. Denies leaking of fluid.   The following portions of the patient's history were reviewed and updated as appropriate: allergies, current medications, past family history, past medical history, past social history, past surgical history and problem list. Problem list updated.  Objective:   Vitals:   08/23/19 1127  BP: 127/78  Pulse: 86  Weight: 153 lb (69.4 kg)    Fetal Status: Fetal Heart Rate (bpm): 140   Movement: Present     General:  Alert, oriented and cooperative. Patient is in no acute distress.  Skin: Skin is warm and dry. No rash noted.   Cardiovascular: Normal heart rate noted  Respiratory: Normal respiratory effort, no problems with respiration noted  Abdomen: Soft, gravid, appropriate for gestational age. Pain/Pressure: Absent     Pelvic:  Cervical exam deferred        Extremities: Normal range of motion.  Edema: Trace  Mental Status: Normal mood and affect. Normal behavior. Normal judgment and thought content.   Urinalysis:      Assessment and Plan:  Pregnancy: [id number] at [redacted]w[redacted]d  1. Supervision of high risk pregnancy, antepartum Stable Labor precautions  2. Gestational diabetes mellitus (GDM) affecting pregnancy CBG's in goal range Continue with diet Growth and BPP tomorrow Repeat ECHO normal cardiac structures  3. Multigravida of advanced maternal age in third  trimester Stable  4. Transient hypertension of pregnancy in third trimester Normal BP today No S/Sx of GHTN or PEC  Term labor symptoms and general obstetric precautions including but not limited to vaginal bleeding, contractions, leaking of fluid and fetal movement were reviewed in detail with the patient. Please refer to After Visit Summary for other counseling recommendations.  Return in about 1 week (around 08/30/2019) for OB visit, face to face, MD provider.   Hermina Staggers, MD

## 2019-08-24 ENCOUNTER — Ambulatory Visit (HOSPITAL_COMMUNITY): Admission: RE | Admit: 2019-08-24 | Payer: Self-pay | Source: Ambulatory Visit

## 2019-08-24 ENCOUNTER — Ambulatory Visit (HOSPITAL_COMMUNITY): Payer: Self-pay

## 2019-08-28 ENCOUNTER — Telehealth: Payer: Self-pay | Admitting: Obstetrics and Gynecology

## 2019-08-28 NOTE — Telephone Encounter (Signed)
Called patient to ask COVID-19 questions. Regina Cross stated she really would like this visit to be virtual. She feels like they are not doing much except listen to the heartbeat. She stated she knew her baby was fine because she could feel the baby moving. She has a hard time getting here, and she is getting induced next week. Although no one has talked to her about this. Please call patient back to instruct her on rather or not she can have this visit virtually.

## 2019-08-28 NOTE — Telephone Encounter (Signed)
Can do virtual visit tomorrow.  Regina Cross

## 2019-08-28 NOTE — Telephone Encounter (Signed)
Called pt to let her know that her visit tomorrow can be virtual per Dr. Alysia Penna. LM for pt that appt has been changed.

## 2019-08-29 ENCOUNTER — Encounter: Payer: Self-pay | Admitting: Obstetrics and Gynecology

## 2019-08-29 ENCOUNTER — Telehealth: Payer: Self-pay | Admitting: Obstetrics and Gynecology

## 2019-08-29 DIAGNOSIS — O0990 Supervision of high risk pregnancy, unspecified, unspecified trimester: Secondary | ICD-10-CM

## 2019-08-29 NOTE — Progress Notes (Signed)
4:20P- Called Pt for My Chart visit, no answer, left VM that will call back in 10 to 15 minutes.    4:31p- 2nd attempt, still no answer,left VM to reschedule.  As noted above.

## 2019-09-07 ENCOUNTER — Encounter: Payer: Self-pay | Admitting: Obstetrics and Gynecology

## 2019-09-07 NOTE — Progress Notes (Signed)
Reviewed data from [employer], patient has not been logging CBGs 4x per day. MyChart message sent asking patient to make sure she is checking CBGs and logging them into app.   Message sent to office to get her rescheduled for appointment.  Baldemar Lenis, M.D. Attending [facility])

## 2019-09-10 ENCOUNTER — Telehealth: Payer: Self-pay | Admitting: Obstetrics & Gynecology

## 2019-09-10 ENCOUNTER — Other Ambulatory Visit: Payer: Self-pay

## 2019-09-10 ENCOUNTER — Telehealth (INDEPENDENT_AMBULATORY_CARE_PROVIDER_SITE_OTHER): Payer: Self-pay | Admitting: Family Medicine

## 2019-09-10 VITALS — BP 127/80

## 2019-09-10 DIAGNOSIS — O283 Abnormal ultrasonic finding on antenatal screening of mother: Secondary | ICD-10-CM

## 2019-09-10 DIAGNOSIS — O0992 Supervision of high risk pregnancy, unspecified, second trimester: Secondary | ICD-10-CM

## 2019-09-10 DIAGNOSIS — O09523 Supervision of elderly multigravida, third trimester: Secondary | ICD-10-CM

## 2019-09-10 DIAGNOSIS — O24419 Gestational diabetes mellitus in pregnancy, unspecified control: Secondary | ICD-10-CM

## 2019-09-10 DIAGNOSIS — O0990 Supervision of high risk pregnancy, unspecified, unspecified trimester: Secondary | ICD-10-CM

## 2019-09-10 DIAGNOSIS — Z3A20 20 weeks gestation of pregnancy: Secondary | ICD-10-CM

## 2019-09-10 NOTE — Telephone Encounter (Signed)
Patient called in, in regards to missed appointment and concerns with possible being induced early due to health issues. Scheduled for the next available slot today. Informed the patient of the mychart visit, the patient verbalized understanding.

## 2019-09-10 NOTE — Progress Notes (Signed)
TELEHEALTH OBSTETRICS PRENATAL VIRTUAL VIDEO VISIT ENCOUNTER NOTE  Provider location: [facility] [place]   I connected with Regina Cross on 09/10/19 at  1:35 PM EST by MyChart Video Encounter at home and verified that I am speaking with the correct person using two identifiers.   I discussed the limitations, risks, security and privacy concerns of performing an evaluation and management service virtually and the availability of in person appointments. I also discussed with the patient that there may be a patient responsible charge related to this service. The patient expressed understanding and agreed to proceed. Subjective:  Regina Cross is a 44 y.o. [id number] at [redacted]w[redacted]d being seen today for ongoing prenatal care.  She is currently monitored for the following issues for this high-risk pregnancy and has Supervision of high risk pregnancy, antepartum; Abnormal fetal ultrasound; AMA (advanced maternal age) multigravida 35+; Patient noncompliance; Gestational diabetes mellitus (GDM) affecting pregnancy; and Transient hypertension of pregnancy in third trimester on their problem list.  Patient reports occasional contractions.  Contractions: Irritability. Vag. Bleeding: None.  Movement: Present. Denies any leaking of fluid.   The following portions of the patient's history were reviewed and updated as appropriate: allergies, current medications, past family history, past medical history, past social history, past surgical history and problem list.   Objective:   Vitals:   09/10/19 1327  BP: 127/80    Fetal Status:     Movement: Present     General:  Alert, oriented and cooperative. Patient is in no acute distress.  Respiratory: Normal respiratory effort, no problems with respiration noted  Mental Status: Normal mood and affect. Normal behavior. Normal judgment and thought content.  Rest of physical exam deferred due to type of encounter  Imaging: [place] MFM FETAL BPP  W/NONSTRESS  Result Date: 08/15/2019 ----------------------------------------------------------------------  OBSTETRICS REPORT                       (Signed Final 08/15/2019 10:19 am) ---------------------------------------------------------------------- Patient Info  ID #:       [id number]                          D.O.B.:  July 17, 1976 (43 yrs)  Name:       Regina Cross                 Visit Date: 08/15/2019 09:38 am ---------------------------------------------------------------------- Performed By  Performed By:     Lenise Arena        Ref. Address:     [address]                    [place]                                                             [address]  Attending:        Ma Rings MD         [facility]  Referred By:      [facility] ---------------------------------------------------------------------- Orders   #  Description                          Code         Ordered By   1  [place] MFM FETAL BPP                     12458.0      Rosana Hoes      W/NONSTRESS  ----------------------------------------------------------------------   #  Order #                    Accession #                 Episode #   1  [id number]                  [id number]                  [id number]  ---------------------------------------------------------------------- Indications   Advanced maternal age multigravida 71+,        O33.523   third trimester   Encounter for other antenatal screening        Z36.2   follow-up   Fetal abnormality - other known or             O35.9XX0   suspected (cystic hygroma) (low risk NIPS)   Low lying placenta, antepartum (resolved)      O44.40   Gestational diabetes in pregnancy,             O24.419   unspecified control   [redacted] weeks gestation of pregnancy                Z3A.35  ---------------------------------------------------------------------- Vital Signs  Weight (lb): 156                               Height:         5'2"  BMI:         28.53 ---------------------------------------------------------------------- Fetal Evaluation  Num Of Fetuses:         1  Fetal Heart Rate(bpm):  161  Cardiac Activity:       Observed  Presentation:           Cephalic  Amniotic Fluid  AFI FV:      Within normal limits  AFI Sum(cm)     %Tile       Largest Pocket(cm)  16.95           63          5.92  RUQ(cm)       RLQ(cm)       LUQ(cm)        LLQ(cm)  5.92          4.41          1.72           4.9 ---------------------------------------------------------------------- Biophysical Evaluation  Amniotic F.V:   Within normal limits       F. Tone:        Observed  F. Movement:    Observed                   N.S.T:          Reactive  F. Breathing:   Observed  Score:          10/10 ---------------------------------------------------------------------- OB History  Gravidity:    6         Term:   3        Prem:   0        SAB:   2  TOP:          0       Ectopic:  0        Living: 3 ---------------------------------------------------------------------- Gestational Age  LMP:           35w 6d        Date:  12/07/18                 EDD:   09/13/19  Best:          Barbie Haggis 6d     Det. By:  LMP  (12/07/18)          EDD:   09/13/19 ---------------------------------------------------------------------- Anatomy  Thoracic:              Appears normal         Bladder:                Appears normal  Stomach:               Appears normal, left                         sided ---------------------------------------------------------------------- Cervix Uterus Adnexa  Cervix  Not visualized (advanced GA >24wks) ---------------------------------------------------------------------- Comments  This patient was seen for fetal testing as she is over the age  of 44.  She denies any problems since her last exam.  A biophysical profile performed today was 8 out of 8.  The  patient also had a reactive nonstress test today.  There was normal amniotic fluid noted on  today's ultrasound  exam.  A follow-up exam was scheduled in 1 week. ----------------------------------------------------------------------                   Johnell Comings, MD Electronically Signed Final Report   08/15/2019 10:19 am ----------------------------------------------------------------------   Assessment and Plan:  Pregnancy: [id number] at [redacted]w[redacted]d 1. Gestational diabetes mellitus (GDM) affecting pregnancy Not checking CBGs frequently.  Will schedule induction for 40wks  2. Abnormal fetal ultrasound  3. Supervision of high risk pregnancy, antepartum Good fetal movement  4. Multigravida of advanced maternal age in third trimester   Term labor symptoms and general obstetric precautions including but not limited to vaginal bleeding, contractions, leaking of fluid and fetal movement were reviewed in detail with the patient. I discussed the assessment and treatment plan with the patient. The patient was provided an opportunity to ask questions and all were answered. The patient agreed with the plan and demonstrated an understanding of the instructions. The patient was advised to call back or seek an in-person office evaluation/go to [facility] at [facility] for any urgent or concerning symptoms. Please refer to After Visit Summary for other counseling recommendations.   I provided 13 minutes of face-to-face time during this encounter.  No follow-ups on file.  No future appointments.  [place] [facility] [employer], [place]

## 2019-09-10 NOTE — Progress Notes (Signed)
I connected with  Regina Cross on 09/10/19 at 1326 by telephone and verified that I am speaking with the correct person using two identifiers.   I discussed the limitations, risks, security and privacy concerns of performing an evaluation and management service by telephone and the availability of in person appointments. I also discussed with the patient that there may be a patient responsible charge related to this service. The patient expressed understanding and agreed to proceed.      Regina Bicker, RN 09/10/2019  1:26 PM

## 2019-09-14 ENCOUNTER — Inpatient Hospital Stay (HOSPITAL_COMMUNITY): Payer: Self-pay | Admitting: Anesthesiology

## 2019-09-14 ENCOUNTER — Other Ambulatory Visit: Payer: Self-pay

## 2019-09-14 ENCOUNTER — Inpatient Hospital Stay (HOSPITAL_COMMUNITY)
Admission: AD | Admit: 2019-09-14 | Discharge: 2019-09-16 | DRG: 807 | Disposition: A | Discharge status: Home / Self Care | Payer: Self-pay | Attending: Family Medicine | Admitting: Family Medicine

## 2019-09-14 DIAGNOSIS — Z3A40 40 weeks gestation of pregnancy: Secondary | ICD-10-CM

## 2019-09-14 DIAGNOSIS — O134 Gestational [pregnancy-induced] hypertension without significant proteinuria, complicating childbirth: Secondary | ICD-10-CM | POA: Diagnosis present

## 2019-09-14 DIAGNOSIS — Z20822 Contact with and (suspected) exposure to covid-19: Secondary | ICD-10-CM | POA: Diagnosis present

## 2019-09-14 DIAGNOSIS — Z91199 Patient's noncompliance with other medical treatment and regimen due to unspecified reason: Secondary | ICD-10-CM

## 2019-09-14 DIAGNOSIS — O283 Abnormal ultrasonic finding on antenatal screening of mother: Secondary | ICD-10-CM | POA: Diagnosis present

## 2019-09-14 DIAGNOSIS — O24420 Gestational diabetes mellitus in childbirth, diet controlled: Principal | ICD-10-CM | POA: Diagnosis present

## 2019-09-14 DIAGNOSIS — O0990 Supervision of high risk pregnancy, unspecified, unspecified trimester: Secondary | ICD-10-CM

## 2019-09-14 DIAGNOSIS — O24419 Gestational diabetes mellitus in pregnancy, unspecified control: Secondary | ICD-10-CM | POA: Diagnosis present

## 2019-09-14 DIAGNOSIS — Z3490 Encounter for supervision of normal pregnancy, unspecified, unspecified trimester: Secondary | ICD-10-CM | POA: Diagnosis present

## 2019-09-14 DIAGNOSIS — Z87891 Personal history of nicotine dependence: Secondary | ICD-10-CM

## 2019-09-14 DIAGNOSIS — O365930 Maternal care for other known or suspected poor fetal growth, third trimester, not applicable or unspecified: Secondary | ICD-10-CM | POA: Diagnosis present

## 2019-09-14 DIAGNOSIS — Z9119 Patient's noncompliance with other medical treatment and regimen: Secondary | ICD-10-CM

## 2019-09-14 DIAGNOSIS — O09529 Supervision of elderly multigravida, unspecified trimester: Secondary | ICD-10-CM

## 2019-09-14 LAB — CBC
HCT: 32.4 % — ABNORMAL LOW (ref 36.0–46.0)
Hemoglobin: 11 g/dL — ABNORMAL LOW (ref 12.0–15.0)
MCH: 28.4 pg (ref 26.0–34.0)
MCHC: 34 g/dL (ref 30.0–36.0)
MCV: 83.7 fL (ref 80.0–100.0)
Platelets: 240 10*3/uL (ref 150–400)
RBC: 3.87 MIL/uL (ref 3.87–5.11)
RDW: 14.2 % (ref 11.5–15.5)
WBC: 8.5 10*3/uL (ref 4.0–10.5)
nRBC: 0 % (ref 0.0–0.2)

## 2019-09-14 LAB — RESPIRATORY PANEL BY RT PCR (FLU A&B, COVID)
Influenza A by PCR: NEGATIVE
Influenza B by PCR: NEGATIVE
SARS Coronavirus 2 by RT PCR: NEGATIVE

## 2019-09-14 LAB — TYPE AND SCREEN
ABO/RH(D): O POS
Antibody Screen: NEGATIVE

## 2019-09-14 LAB — POCT FERN TEST

## 2019-09-14 LAB — ABO/RH: ABO/RH(D): O POS

## 2019-09-14 MED ORDER — ACETAMINOPHEN 325 MG PO TABS: 650.0000 mg | ORAL_TABLET | ORAL | Status: DC | PRN

## 2019-09-14 MED ORDER — OXYCODONE-ACETAMINOPHEN 5-325 MG PO TABS: 1.0000 | ORAL_TABLET | ORAL | Status: DC | PRN

## 2019-09-14 MED ORDER — OXYTOCIN 40 UNITS IN NORMAL SALINE INFUSION - SIMPLE MED
2.5000 [IU]/h | INTRAVENOUS | Status: DC
  Administered 2019-09-15: 05:00:00 2.5 [IU]/h via INTRAVENOUS
  Filled 2019-09-14: qty 1000

## 2019-09-14 MED ORDER — OXYTOCIN BOLUS FROM INFUSION
500.0000 mL | Freq: Once | INTRAVENOUS | Status: AC
  Administered 2019-09-15: 05:00:00 500 mL via INTRAVENOUS

## 2019-09-14 MED ORDER — SOD CITRATE-CITRIC ACID 500-334 MG/5ML PO SOLN: 30.0000 mL | ORAL | Status: DC | PRN

## 2019-09-14 MED ORDER — OXYCODONE-ACETAMINOPHEN 5-325 MG PO TABS: 2.0000 | ORAL_TABLET | ORAL | Status: DC | PRN

## 2019-09-14 MED ORDER — MISOPROSTOL 25 MCG QUARTER TABLET: 25.0000 ug | ORAL_TABLET | ORAL | Status: DC | PRN

## 2019-09-14 MED ORDER — LACTATED RINGERS IV SOLN: INTRAVENOUS | Status: DC

## 2019-09-14 MED ORDER — FENTANYL-BUPIVACAINE-NACL 0.5-0.125-0.9 MG/250ML-% EP SOLN
12.0000 mL/h | EPIDURAL | Status: DC | PRN
  Filled 2019-09-14: qty 250

## 2019-09-14 MED ORDER — LIDOCAINE HCL (PF) 1 % IJ SOLN: 30.0000 mL | INTRAMUSCULAR | Status: DC | PRN

## 2019-09-14 MED ORDER — EPHEDRINE 5 MG/ML INJ
10.0000 mg | INTRAVENOUS | Status: DC | PRN
  Administered 2019-09-15: 01:00:00 10 mg via INTRAVENOUS
  Filled 2019-09-14: qty 10

## 2019-09-14 MED ORDER — PHENYLEPHRINE 40 MCG/ML (10ML) SYRINGE FOR IV PUSH (FOR BLOOD PRESSURE SUPPORT): 80.0000 ug | PREFILLED_SYRINGE | INTRAVENOUS | Status: DC | PRN

## 2019-09-14 MED ORDER — EPHEDRINE 5 MG/ML INJ: 10.0000 mg | INTRAVENOUS | Status: DC | PRN

## 2019-09-14 MED ORDER — DIPHENHYDRAMINE HCL 50 MG/ML IJ SOLN: 12.5000 mg | INTRAMUSCULAR | Status: DC | PRN

## 2019-09-14 MED ORDER — TERBUTALINE SULFATE 1 MG/ML IJ SOLN: 0.2500 mg | Freq: Once | INTRAMUSCULAR | Status: DC | PRN

## 2019-09-14 MED ORDER — LACTATED RINGERS IV SOLN: 500.0000 mL | INTRAVENOUS | Status: DC | PRN

## 2019-09-14 MED ORDER — FENTANYL CITRATE (PF) 100 MCG/2ML IJ SOLN: 100.0000 ug | INTRAMUSCULAR | Status: DC | PRN

## 2019-09-14 MED ORDER — ONDANSETRON HCL 4 MG/2ML IJ SOLN: 4.0000 mg | Freq: Four times a day (QID) | INTRAMUSCULAR | Status: DC | PRN

## 2019-09-14 MED ORDER — LACTATED RINGERS IV SOLN: 500.0000 mL | Freq: Once | INTRAVENOUS | Status: DC

## 2019-09-14 NOTE — MAU Note (Signed)
Pt states water broke around 1730-clear fluid, tinted with yellow. Started having contractions after that, now every 10 minutes or so. Denies vaginal bleeding. Reports good fetal movement. States she was 1cm on last exam.

## 2019-09-14 NOTE — MAU Note (Signed)
Report given to L&D nurse Maryjean Morn, RN.

## 2019-09-14 NOTE — OB Triage Provider Note (Signed)
None      S: Ms. Regina Cross is a 44 y.o. [phone] at [redacted]w[redacted]d  who presents to MAU today complaining of leaking of fluid since 530pm. Patient reports she felt a "pop" in her lower abdomen, then had a gush of fluid.  Patient states that she did not wear a pad, but did put paper towels in her underwear which was saturated upon arrival. She states she was having BH contractions yesterday, but started having stronger contractions after gush of fluid. She denies vaginal bleeding.  She reports normal fetal movement.    O: BP 128/80   Pulse 63   Temp 97.8 F (36.6 C) (Oral)   Resp 18   Ht 5\' 2"  (1.575 m)   Wt 66.3 kg   LMP 12/07/2018 (Exact Date)   SpO2 100%   BMI 26.74 kg/m  GENERAL: Well-developed, well-nourished female in no acute distress.  HEAD: Normocephalic, atraumatic.  CHEST: Normal effort of breathing, regular heart rate ABDOMEN: Soft, nontender, gravid PELVIC: Normal external female genitalia. Vagina is pink and rugated. Normal discharge.  No apparent pooling.  Cervix with normal contour, no lesions. Black fetal hair protruding from os.  Fern Collected  Cervical exam:  Dilation: 2 Effacement (%): 70 Station: -2 Presentation: Vertex Exam by:: [email or web address], RN   Fetal Monitoring: FHT: 125 bpm, Mod Var, -Decels, +Accels Toco: Q1-59min  No results found for this or any previous visit (from the past 24 hour(s)).   A: SIUP at [redacted]w[redacted]d  SROM  Cat I FT  P: Patient informed that SROM apparent with fetal hair protruding from cervix.  Discussed admission and potential for augmentation if no change after period of expectant management.  Questions and concerns addressed. Congratulations given Nurse instructed to contact labor team.    [redacted]w[redacted]d, CNM 09/14/2019 8:28 PM

## 2019-09-14 NOTE — H&P (Addendum)
LABOR AND DELIVERY ADMISSION HISTORY AND PHYSICAL NOTE  Regina Cross is a 44 y.o. female [phone] with IUP at [redacted]w[redacted]d by LMP presenting for SOL w/ SROM (clear) @ 1730. She reports that her water broke and was clear, tinted w/ yellow fluid, after which she began having contractions. She reports positive fetal movement. She denies vaginal bleeding.   She plans on bottle feeding. Her contraception plan is: POPs, condoms but would like to discuss other options.  Prenatal History/Complications: PNC at [facility]:  @[redacted]w[redacted]d , CWD, normal anatomy, cephalic presentation, posterior placenta, 15%ile, EFW 1904 g  Pregnancy complications:  - AMA - GDMA1  Past Medical History: Past Medical History:  Diagnosis Date  . Gestational diabetes     Past Surgical History: Past Surgical History:  Procedure Laterality Date  . NO PAST SURGERIES      Obstetrical History: OB History    Gravida  6   Para  3   Term  3   Preterm      AB  2   Living  3     SAB  2   TAB      Ectopic      Multiple      Live Births  3           Social History: Social History   Socioeconomic History  . Marital status: Single    Spouse name: Not on file  . Number of children: 3  . Years of education: Not on file  . Highest education level: Not on file  Occupational History  . Not on file  Tobacco Use  . Smoking status: Former Smoker    Packs/day: 0.25    Types: Cigarettes    Quit date: 01/30/2019    Years since quitting: 0.6  . Smokeless tobacco: Never Used  Substance and Sexual Activity  . Alcohol use: Not Currently  . Drug use: Not Currently    Types: Marijuana    Comment: none since end of June  . Sexual activity: Yes    Birth control/protection: None  Other Topics Concern  . Not on file  Social History Narrative  . Not on file   Social Determinants of Health   Financial Resource Strain: Unknown  . Difficulty of Paying Living Expenses: Patient refused  Food Insecurity: Unknown  .  Worried About July in the Last Year: Patient refused  . Ran Out of Food in the Last Year: Patient refused  Transportation Needs: Unknown  . Lack of Transportation (Medical): Patient refused  . Lack of Transportation (Non-Medical): Patient refused  Physical Activity: Unknown  . Days of Exercise per Week: Patient refused  . Minutes of Exercise per Session: Patient refused  Stress: Unknown  . Feeling of Stress : Patient refused  Social Connections: Unknown  . Frequency of Communication with Friends and Family: Patient refused  . Frequency of Social Gatherings with Friends and Family: Patient refused  . Attends Religious Services: Patient refused  . Active Member of Clubs or Organizations: Patient refused  . Attends Programme researcher, broadcasting/film/video Meetings: Patient refused  . Marital Status: Patient refused    Family History: Family History  Problem Relation Age of Onset  . Hypertension Mother     Allergies: No Known Allergies  Medications Prior to Admission  Medication Sig Dispense Refill Last Dose  . aspirin EC 81 MG tablet Take 1 tablet (81 mg total) by mouth daily. 60 tablet 3 09/13/2019 at Unknown time  . ferrous sulfate 324 (  65 Fe) MG TBEC Take 1 tablet (325 mg total) by mouth every other day. 30 tablet 2 09/13/2019 at Unknown time  . Prenatal Vit-Fe Fumarate-FA (MULTIVITAMIN-PRENATAL) 27-0.8 MG TABS tablet Take 1 tablet by mouth daily at 12 noon. 30 tablet 6 09/13/2019 at Unknown time  . pyridOXINE (B-6) 50 MG tablet Take 50 mg by mouth daily.        Review of Systems  All systems reviewed and negative except as stated in HPI  Physical Exam Blood pressure (!) 147/79, pulse 73, temperature 97.8 F (36.6 C), temperature source Oral, resp. rate 18, height 5\' 2"  (1.575 m), weight 66.3 kg, last menstrual period 12/07/2018, SpO2 100 %. General appearance: alert, oriented, NAD Lungs: normal respiratory effort Heart: regular rate Abdomen: soft, non-tender;  gravid Extremities: No calf swelling or tenderness Presentation: cephalic by RN SVE in MAU, and confirmed w/ leopolds  Fetal monitoring: Baseline: 120 bpm, Variability: Good {> 6 bpm), Accelerations: Reactive and Decelerations: Absent Uterine activity: Frequency: Every 2-3 minutes and Intensity: moderate  Dilation: 2 Effacement (%): 70 Station: -2 Exam by:: Verdell Carmine, RN  Prenatal labs: ABO, Rh: --/--/O POS, O POS Performed at [facility], [address], [place], [place] [address]  [phone]/29 2045) Antibody: NEG (01/29 2045) Rubella: 13.40 (09/02 1654) RPR: Non Reactive (10/29 0947)  HBsAg: Negative (09/02 1654)  HIV: Non Reactive (10/29 0947)  GC/Chlamydia: Negative  GBS: Negative/-- (12/30 1106)  2-hr GTT: abnormal Genetic screening:  Low risk Anatomy US: normal anatomy  Prenatal Transfer Tool  Maternal Diabetes: Yes:  Diabetes Type:  Diet controlled Genetic Screening: Normal Maternal Ultrasounds/Referrals: Normal Fetal Ultrasounds or other Referrals:  Fetal echo, normal but some views c/f possible defect though likely artifact. BPP on 12/30 8/8. Maternal Substance Abuse:  No Significant Maternal Medications:  Meds include: Other: Aspirin 81mg  Significant Maternal Lab Results: Group B Strep negative  Results for orders placed or performed during the hospital encounter of 09/14/19 (from the past 24 hour(s))  Type and screen   Collection Time: 09/14/19  8:45 PM  Result Value Ref Range   ABO/RH(D) O POS    Antibody Screen NEG    Sample Expiration      09/17/2019,2359 Performed at [facility], [place] [address] [place], [place] [address]   ABO/Rh   Collection Time: 09/14/19  8:45 PM  Result Value Ref Range   ABO/RH(D)      O POS Performed at [facility] [address], [place], [place] [address]   POCT fern test   Collection Time: 09/14/19  9:17 PM  Result Value Ref Range   POCT Fern Test      Patient Active Problem List   Diagnosis  Date Noted  . Encounter for induction of labor 09/14/2019  . Transient hypertension of pregnancy in third trimester 08/01/2019  . Gestational diabetes mellitus (GDM) affecting pregnancy 06/19/2019  . Patient noncompliance 05/02/2019  . AMA (advanced maternal age) multigravida 35+ 04/18/2019  . Abnormal fetal ultrasound 02/23/2019  . Supervision of high risk pregnancy, antepartum 01/30/2019    Assessment: Regina Cross is a 44 y.o. [id number] at [redacted]w[redacted]d here for SOL w/ SROM (clear)  #Labor: Progressing normally, SROM @1730 . #Fetal Wellbeing:  Category I #Pain Control: Epidural and IV pain meds upon request #GBS/ID: negative #COVID: swab pending #MOF: Bottle #MOC: POPs, condoms, open to discussing other options #Anticipated MOD: NSVD #GDMA1: Well controlled w/ diet. q4h CBGs during latent labor, q2h during active labor #AMA: Compliant w/  Aspirin 81 mg daily   Lorri Frederick, DO, PGY-1 Family Medicine Resident, [facility] Faculty Teaching Service  09/14/2019, 9:43 PM  I saw and evaluated the patient. I agree with the findings and the plan of care as documented in the resident's note. Expectant management. Anticipate SVD. EFW 3300g.  Jerilynn Birkenhead, MD [facility] Family Medicine Fellow, [facility] for [employer], [facility]

## 2019-09-15 ENCOUNTER — Encounter (HOSPITAL_COMMUNITY): Payer: Self-pay | Admitting: Family Medicine

## 2019-09-15 DIAGNOSIS — Z3A40 40 weeks gestation of pregnancy: Secondary | ICD-10-CM

## 2019-09-15 DIAGNOSIS — O24420 Gestational diabetes mellitus in childbirth, diet controlled: Secondary | ICD-10-CM

## 2019-09-15 LAB — COMPREHENSIVE METABOLIC PANEL
ALT: 81 U/L — ABNORMAL HIGH (ref 0–44)
AST: 52 U/L — ABNORMAL HIGH (ref 15–41)
Albumin: 2.4 g/dL — ABNORMAL LOW (ref 3.5–5.0)
Alkaline Phosphatase: 193 U/L — ABNORMAL HIGH (ref 38–126)
Anion gap: 5 (ref 5–15)
BUN: 10 mg/dL (ref 6–20)
CO2: 22 mmol/L (ref 22–32)
Calcium: 9.1 mg/dL (ref 8.9–10.3)
Chloride: 107 mmol/L (ref 98–111)
Creatinine, Ser: 0.83 mg/dL (ref 0.44–1.00)
GFR calc Af Amer: >60 mL/min (ref >60)
GFR calc non Af Amer: >60 mL/min (ref >60)
Glucose, Bld: 81 mg/dL (ref 70–99)
Potassium: 4.1 mmol/L (ref 3.5–5.1)
Sodium: 134 mmol/L — ABNORMAL LOW (ref 135–145)
Total Bilirubin: 0.5 mg/dL (ref 0.3–1.2)
Total Protein: 5.7 g/dL — ABNORMAL LOW (ref 6.5–8.1)

## 2019-09-15 LAB — RPR: RPR Ser Ql: NONREACTIVE

## 2019-09-15 LAB — GLUCOSE, CAPILLARY: Glucose-Capillary: 99 mg/dL (ref 70–99)

## 2019-09-15 MED ORDER — SENNOSIDES-DOCUSATE SODIUM 8.6-50 MG PO TABS: 2.0000 | ORAL_TABLET | ORAL | Status: DC

## 2019-09-15 MED ORDER — SIMETHICONE 80 MG PO CHEW: 80.0000 mg | CHEWABLE_TABLET | ORAL | Status: DC | PRN

## 2019-09-15 MED ORDER — MEASLES, MUMPS & RUBELLA VAC IJ SOLR: 0.5000 mL | Freq: Once | INTRAMUSCULAR | Status: DC

## 2019-09-15 MED ORDER — ACETAMINOPHEN 325 MG PO TABS: 650.0000 mg | ORAL_TABLET | Freq: Four times a day (QID) | ORAL | Status: DC | PRN

## 2019-09-15 MED ORDER — DIBUCAINE (PERIANAL) 1 % EX OINT: 1.0000 "application " | TOPICAL_OINTMENT | CUTANEOUS | Status: DC | PRN

## 2019-09-15 MED ORDER — DIPHENHYDRAMINE HCL 25 MG PO CAPS: 25.0000 mg | ORAL_CAPSULE | Freq: Four times a day (QID) | ORAL | Status: DC | PRN

## 2019-09-15 MED ORDER — ONDANSETRON HCL 4 MG PO TABS: 4.0000 mg | ORAL_TABLET | ORAL | Status: DC | PRN

## 2019-09-15 MED ORDER — BENZOCAINE-MENTHOL 20-0.5 % EX AERO
1.0000 "application " | INHALATION_SPRAY | CUTANEOUS | Status: DC | PRN
  Administered 2019-09-16: 1 via TOPICAL
  Filled 2019-09-15: qty 56

## 2019-09-15 MED ORDER — PRENATAL MULTIVITAMIN CH
1.0000 | ORAL_TABLET | Freq: Every day | ORAL | Status: DC
  Administered 2019-09-16: 1 via ORAL
  Filled 2019-09-15: qty 1

## 2019-09-15 MED ORDER — ONDANSETRON HCL 4 MG/2ML IJ SOLN: 4.0000 mg | INTRAMUSCULAR | Status: DC | PRN

## 2019-09-15 MED ORDER — IBUPROFEN 600 MG PO TABS
600.0000 mg | ORAL_TABLET | Freq: Three times a day (TID) | ORAL | Status: DC | PRN
  Administered 2019-09-15 – 2019-09-16 (×2): 600 mg via ORAL
  Filled 2019-09-15 (×2): qty 1

## 2019-09-15 MED ORDER — TETANUS-DIPHTH-ACELL PERTUSSIS 5-2.5-18.5 LF-MCG/0.5 IM SUSP: 0.5000 mL | Freq: Once | INTRAMUSCULAR | Status: DC

## 2019-09-15 MED ORDER — LACTATED RINGERS AMNIOINFUSION: INTRAVENOUS | Status: DC

## 2019-09-15 MED ORDER — LIDOCAINE-EPINEPHRINE (PF) 2 %-1:200000 IJ SOLN
INTRAMUSCULAR | Status: DC | PRN
  Administered 2019-09-14 (×2): 2 mL via EPIDURAL

## 2019-09-15 MED ORDER — WITCH HAZEL-GLYCERIN EX PADS: 1.0000 "application " | MEDICATED_PAD | CUTANEOUS | Status: DC | PRN

## 2019-09-15 MED ORDER — COCONUT OIL OIL: 1.0000 "application " | TOPICAL_OIL | Status: DC | PRN

## 2019-09-15 MED ORDER — SODIUM CHLORIDE (PF) 0.9 % IJ SOLN
INTRAMUSCULAR | Status: DC | PRN
  Administered 2019-09-14: 12 mL/h via EPIDURAL

## 2019-09-15 NOTE — Discharge Summary (Signed)
  Postpartum Discharge Summary     Patient Name: Regina Cross DOB: 02/02/1976 MRN: [id number]  Date of admission: 09/14/2019 Delivering Provider: FAIR, CHELSEA N   Date of discharge: 09/16/2019  Admitting diagnosis: Encounter for induction of labor [Z34.90] Intrauterine pregnancy: [redacted]w[redacted]d     Secondary diagnosis:  Active Problems:   Abnormal fetal ultrasound   AMA (advanced maternal age) multigravida 35+   Patient noncompliance   Gestational diabetes mellitus (GDM) affecting pregnancy   Encounter for induction of labor  Additional problems: None     Discharge diagnosis: Term Pregnancy Delivered and GDM A1                                                                                                Post partum procedures:N/A  Augmentation: None  Complications: None  Hospital course:  Onset of Labor With Vaginal Delivery     44 y.o. yo G6P3023 at [redacted]w[redacted]d was admitted in Latent Labor on 09/14/2019. Patient had an uncomplicated labor course as follows: Initial SVE 2/70/-2. Patient arrived to MAU for SROM/SOL. Received epidural and progressed to complete without augmentation.  Membrane Rupture Time/Date: 5:30 PM ,09/14/2019   Intrapartum Procedures: Episiotomy: None [1]                                         Lacerations:  None [1]  Patient had a delivery of a Viable infant. 09/15/2019  Information for the patient's newborn:  Philson, Girl Marjan [[id number]]  Delivery Method: Vaginal, Spontaneous(Filed from Delivery Summary)     Pateint had an uncomplicated postpartum course. Fasting AM glucose: 99. She is ambulating, tolerating a regular diet, passing flatus, and urinating well. Patient is discharged home in stable condition on 09/16/19.  Delivery time: 4:43 AM   Magnesium Sulfate received: No BMZ received: No Rhophylac:No MMR:No Transfusion:No  Physical exam  Vitals:   09/15/19 0145 09/15/19 0200 09/15/19 0230 09/15/19 0330  BP: (!) 113/59 (!) 108/57 122/63 117/66   Pulse: 71 67 74 70  Resp: 18 18 18 18  Temp:  98.2 F (36.8 C)    TempSrc:  Oral    SpO2:      Weight:      Height:       General: alert, cooperative and no distress Lochia: appropriate Uterine Fundus: firm Incision: N/A DVT Evaluation: No evidence of DVT seen on physical exam. Labs: Lab Results  Component Value Date   WBC 8.5 09/14/2019   HGB 11.0 (L) 09/14/2019   HCT 32.4 (L) 09/14/2019   MCV 83.7 09/14/2019   PLT 240 09/14/2019   CMP Latest Ref Rng & Units 05/02/2019  Glucose 65 - 99 mg/dL 79  BUN 6 - 24 mg/dL 9  Creatinine 0.57 - 1.00 mg/dL 0.50(L)  Sodium 134 - 144 mmol/L 136  Potassium 3.5 - 5.2 mmol/L 4.3  Chloride 96 - 106 mmol/L 104  CO2 20 - 29 mmol/L 21  Calcium 8.7 - 10.2 mg/dL 9.2  Total Protein 6.0 - 8.5 g/dL 6.4  Total   Bilirubin 0.0 - 1.2 mg/dL 0.2  Alkaline Phos 39 - 117 IU/L 75  AST 0 - 40 IU/L 20  ALT 0 - 32 IU/L 22    Discharge instruction: per After Visit Summary and "Baby and Me Booklet".  After visit meds:  Allergies as of 09/16/2019   No Known Allergies     Medication List    STOP taking these medications   aspirin EC 81 MG tablet   pyridOXINE 50 MG tablet Commonly known as: B-6     TAKE these medications   acetaminophen 325 MG tablet Commonly known as: Tylenol Take 2 tablets (650 mg total) by mouth every 6 (six) hours as needed (for pain scale < 4).   benzocaine-Menthol 20-0.5 % Aero Commonly known as: DERMOPLAST Apply 1 application topically as needed for irritation (perineal discomfort).   ferrous sulfate 324 (65 Fe) MG Tbec Take 1 tablet (325 mg total) by mouth every other day.   ibuprofen 600 MG tablet Commonly known as: ADVIL Take 1 tablet (600 mg total) by mouth every 8 (eight) hours as needed for mild pain.   multivitamin-prenatal 27-0.8 MG Tabs tablet Take 1 tablet by mouth daily at 12 noon.   norethindrone 0.35 MG tablet Commonly known as: MICRONOR Take 1 tablet (0.35 mg total) by mouth daily. Start at 4  weeks postpartum (10/14/2019)       Diet: routine diet  Activity: Advance as tolerated. Pelvic rest for 6 weeks.   Outpatient follow up:4 weeks Follow up Appt:No future appointments. Follow up Visit:  Please schedule this patient for Postpartum visit in: 4 weeks with the following provider: Any provider Virtual High risk pregnancy complicated by: GDM Delivery mode:  SVD Anticipated Birth Control:  POPs PP Procedures needed: 2 hour GTT , pap Schedule Integrated BH visit: no  Newborn Data: Live born female  Birth Weight: 2875g  APGAR (1 MIN): 8  APGAR (5 MINS): 9  APGAR (10 MINS):    Newborn Delivery   Birth date/time: 09/15/2019 04:43:00 Delivery type: Vaginal, Spontaneous      Baby Feeding: Bottle Disposition:home with mother   Mallie Snooks, MSN, CNM Certified Nurse Midwife, [employer] for [employer], [place] 09/16/19 3:13 PM

## 2019-09-15 NOTE — Anesthesia Procedure Notes (Signed)
Epidural Patient location during procedure: OB Start time: 09/15/2019 11:15 PM End time: 09/15/2019 11:30 PM  Staffing Anesthesiologist: Elmer Picker, MD Performed: anesthesiologist   Preanesthetic Checklist Completed: patient identified, IV checked, risks and benefits discussed, monitors and equipment checked, pre-op evaluation and timeout performed  Epidural Patient position: sitting Prep: DuraPrep and site prepped and draped Patient monitoring: continuous pulse ox, blood pressure, heart rate and cardiac monitor Approach: midline Location: L3-L4 Injection technique: LOR air  Needle:  Needle type: Tuohy  Needle gauge: 17 G Needle length: 9 cm Needle insertion depth: 5 cm Catheter type: closed end flexible Catheter size: 19 Gauge Catheter at skin depth: 11 cm Test dose: negative  Assessment Sensory level: T8 Events: blood not aspirated, injection not painful, no injection resistance, no paresthesia and negative IV test  Additional Notes Patient identified. Risks/Benefits/Options discussed with patient including but not limited to bleeding, infection, nerve damage, paralysis, failed block, incomplete pain control, headache, blood pressure changes, nausea, vomiting, reactions to medication both or allergic, itching and postpartum back pain. Confirmed with bedside nurse the patient's most recent platelet count. Confirmed with patient that they are not currently taking any anticoagulation, have any bleeding history or any family history of bleeding disorders. Patient expressed understanding and wished to proceed. All questions were answered. Sterile technique was used throughout the entire procedure. Please see nursing notes for vital signs. Test dose was given through epidural catheter and negative prior to continuing to dose epidural or start infusion. Warning signs of high block given to the patient including shortness of breath, tingling/numbness in hands, complete motor block,  or any concerning symptoms with instructions to call for help. Patient was given instructions on fall risk and not to get out of bed. All questions and concerns addressed with instructions to call with any issues or inadequate analgesia.  Reason for block:procedure for pain

## 2019-09-15 NOTE — Progress Notes (Addendum)
Labor Progress Note Regina Cross is a 44 y.o. [id number] at [redacted]w[redacted]d presented for SROM. S: Comfortable with epidural.   O:  BP (!) 117/55   Pulse 71   Temp 98 F (36.7 C) (Oral)   Resp 18   Ht 5\' 2"  (1.575 m)   Wt 66.3 kg   LMP 12/07/2018 (Exact Date)   SpO2 100%   BMI 26.74 kg/m  EFM: 115, moderate variability, pos accels, intermittent variables and late decels, reactive Toco: q3-34m  CVE: Dilation: 3 Effacement (%): 100 Station: -2 Presentation: Vertex Exam by:: Tery Hoeger   A&P: 44 y.o. 45 [redacted]w[redacted]d here for SROM/SOL. #Labor: Expectant management per patient request. Went bedside to assess lates and variables. Patient given ephedrine for low BP; epidural placed about 1 hour ago. IUPC placed and will start amnioinfusion. FSE placed. FHT overall reassuring. Anticipate SVD. #Pain: epidural #FWB: Cat II #GBS negative #GDMA1: last glucose 99  [redacted]w[redacted]d, MD 12:58 AM  Addendum: D/w Dr. Joselyn Arrow. Will continue for vaginal delivery at this time. Possibly fetal arrhythmia. Has had two fetal echos this pregnancy. Has had two fetal echos this pregnancy due to concern about fetal heart structures by MFM. Both echos WNL. Will plan to have NICU at delivery.  2:03 AM  [name], MD OB Family Medicine Fellow, [facility] for [facility]

## 2019-09-15 NOTE — Anesthesia Preprocedure Evaluation (Signed)
Anesthesia Evaluation  Patient identified by MRN, date of birth, ID band Patient awake    Reviewed: Allergy & Precautions, NPO status , Patient's Chart, lab work & pertinent test results  Airway Mallampati: II  TM Distance: >3 FB Neck ROM: Full    Dental no notable dental hx.    Pulmonary neg pulmonary ROS, former smoker,    Pulmonary exam normal breath sounds clear to auscultation       Cardiovascular negative cardio ROS Normal cardiovascular exam Rhythm:Regular Rate:Normal     Neuro/Psych negative neurological ROS  negative psych ROS   GI/Hepatic negative GI ROS, Neg liver ROS,   Endo/Other  negative endocrine ROSdiabetes, Gestational  Renal/GU negative Renal ROS  negative genitourinary   Musculoskeletal negative musculoskeletal ROS (+)   Abdominal   Peds  Hematology negative hematology ROS (+)   Anesthesia Other Findings   Reproductive/Obstetrics (+) Pregnancy                             Anesthesia Physical Anesthesia Plan  ASA: III  Anesthesia Plan: Epidural   Post-op Pain Management:    Induction:   PONV Risk Score and Plan: Treatment may vary due to age or medical condition  Airway Management Planned: Natural Airway  Additional Equipment:   Intra-op Plan:   Post-operative Plan:   Informed Consent: I have reviewed the patients History and Physical, chart, labs and discussed the procedure including the risks, benefits and alternatives for the proposed anesthesia with the patient or authorized representative who has indicated his/her understanding and acceptance.       Plan Discussed with: Anesthesiologist  Anesthesia Plan Comments: (Patient identified. Risks, benefits, options discussed with patient including but not limited to bleeding, infection, nerve damage, paralysis, failed block, incomplete pain control, headache, blood pressure changes, nausea, vomiting,  reactions to medication, itching, and post partum back pain. Confirmed with bedside nurse the patient's most recent platelet count. Confirmed with the patient that they are not taking any anticoagulation, have any bleeding history or any family history of bleeding disorders. Patient expressed understanding and wishes to proceed. All questions were answered. )        Anesthesia Quick Evaluation

## 2019-09-15 NOTE — Progress Notes (Signed)
Labor Progress Note Lenyx Boody is a 44 y.o. [id number] at [redacted]w[redacted]d presented for SROM. S: Feeling pressure and very uncomfortable.   O:  BP (!) 108/57   Pulse 67   Temp 98.2 F (36.8 C) (Oral)   Resp 18   Ht 5\' 2"  (1.575 m)   Wt 66.3 kg   LMP 12/07/2018 (Exact Date)   SpO2 100%   BMI 26.74 kg/m  EFM: 115, moderate variability, tracing difficult to interpret due to likely fetal arrhythmia, occasional lates Toco: q2-44m  CVE: Dilation: 9 Effacement (%): 100 Station: 0 Presentation: Vertex Exam by:: Dr. [email or web address]   A&P: 43 y.o. 45 [redacted]w[redacted]d here for SROM/SOL. #Labor: Making good cervical change. Will have NICU at delivery due to [facility] and likely fetal arrhythmia. Plan for SVD soon.  #Pain: epidural #FWB: Cat II #GBS negative #GDMA1: last glucose 99  [facility], MD 3:15 AM

## 2019-09-15 NOTE — Consult Note (Signed)
Delivery Note:  SVD    09/15/2019  3:56 AM  I was called to the delivery room at the request of the patient's obstetrician (Dr. Pratt) for abnormal fetal heart rate.  PRENATAL HX:  This is a 43 y/o G6P3023 at 40 and 2/[redacted] weeks gestation who was admitted with SROM (~11 hours).  She is GBS negative.  Her pregnancy has been complicated by AMA and A1 GDM.  A fetal echo x2 had some views that were consistent with possible defect, but thought to be artifact.  MFM recommended echo at 2-4 weeks after delivery.  Fetal heart rate has been consistent low in 110s.    DELIVERY:  Infant was vigorous at delivery, requiring no resuscitation other than standard warming, drying and stimulation.  HR consistently in 160s following delivery.  APGARs 8 and 9. O2 saturations in low 90s by 10 minutes.  Exam notable for molding and likely small for gestational age, otherwise within normal limits.  After 10 minutes, baby left with nurse to assist parents with skin-to-skin care.   _____________________ Electronically Signed By: Lener Ventresca, MD Neonatologist  

## 2019-09-16 LAB — PROTEIN / CREATININE RATIO, URINE
Creatinine, Urine: 83.23 mg/dL
Protein Creatinine Ratio: 0.23 mg/mg{Cre} — ABNORMAL HIGH (ref 0.00–0.15)
Total Protein, Urine: 19 mg/dL

## 2019-09-16 MED ORDER — NORETHINDRONE 0.35 MG PO TABS: 1.0000 | ORAL_TABLET | Freq: Every day | ORAL | 11 refills | Status: DC

## 2019-09-16 MED ORDER — BENZOCAINE-MENTHOL 20-0.5 % EX AERO: 1.0000 "application " | INHALATION_SPRAY | CUTANEOUS | 1 refills | Status: DC | PRN

## 2019-09-16 MED ORDER — IBUPROFEN 600 MG PO TABS: 600.0000 mg | ORAL_TABLET | Freq: Three times a day (TID) | ORAL | 0 refills | Status: DC | PRN

## 2019-09-16 MED ORDER — ACETAMINOPHEN 325 MG PO TABS: 650.0000 mg | ORAL_TABLET | Freq: Four times a day (QID) | ORAL | 3 refills | Status: DC | PRN

## 2019-09-16 NOTE — Discharge Instructions (Signed)

## 2019-09-16 NOTE — Anesthesia Postprocedure Evaluation (Signed)
Anesthesia Post Note  Patient: Regina Cross  Procedure(s) Performed: AN AD HOC LABOR EPIDURAL     Patient location during evaluation: Mother Baby Anesthesia Type: Epidural Level of consciousness: awake and alert Pain management: pain level controlled Vital Signs Assessment: post-procedure vital signs reviewed and stable Respiratory status: spontaneous breathing, nonlabored ventilation and respiratory function stable Cardiovascular status: stable Postop Assessment: no headache, no backache and epidural receding Anesthetic complications: no    Last Vitals:  Vitals:   09/16/19 0241 09/16/19 0500  BP: 124/76 126/78  Pulse: 73 69  Resp: 18   Temp:  36.7 C  SpO2:      Last Pain:  Vitals:   09/16/19 0501  TempSrc:   PainSc: 3    Pain Goal: Patients Stated Pain Goal: 0 (09/15/19 0505)                 Rica Records

## 2019-09-16 NOTE — Progress Notes (Addendum)
Post Partum Day 1 Subjective: Patient reports feeling well. She is tolerating PO. Ambulating and urinating without difficulty. Lochia minimal. Formula feeding.   Objective: Blood pressure 126/78, pulse 69, temperature 98 F (36.7 C), temperature source Oral, resp. rate 18, height 5\' 2"  (1.575 m), weight 66.3 kg, last menstrual period 12/07/2018, SpO2 100 %, unknown if currently breastfeeding.  Physical Exam:  General: alert, cooperative and appears stated age Lochia: appropriate Uterine Fundus: firm Incision: NA DVT Evaluation: No evidence of DVT seen on physical exam.  Recent Labs    09/14/19 2224  HGB 11.0*  HCT 32.4*    Assessment/Plan: Plan for discharge today or tomorrow; should baby be able to discharge today, RN to page team for discharge orders on mom Vitals stable. Two mild-range BP's > 4 hours apart, CMP with mildly elevated LFT's. Pr/Cr pending. gHTN vs Pre-E. Other BP's normal range and will not start anti-hypertensive at this time. GDMA1: Glucose 81 on CMP when not fasting. Cancelled AM CBG. GTT post-partum. Formula feeding Desires POPs prescribed on discharge   LOS: 2 days   Regina Cross 09/16/2019, 7:17 AM

## 2019-09-16 NOTE — Progress Notes (Signed)
RN educated patient and requested urine sample. Pt states she cannot void at this time and will let RN know when she can provide sample. Materials left in bathroom to collect specimen when ready.

## 2019-10-19 ENCOUNTER — Telehealth: Payer: Self-pay | Admitting: Student

## 2019-10-19 DIAGNOSIS — Z5329 Procedure and treatment not carried out because of patient's decision for other reasons: Secondary | ICD-10-CM

## 2019-10-19 DIAGNOSIS — Z91199 Patient's noncompliance with other medical treatment and regimen due to unspecified reason: Secondary | ICD-10-CM

## 2019-10-19 NOTE — Progress Notes (Signed)
1030am LVM to be available for appointment will call back in 10 mins  1045am LVM that I will attempt to call once more and at that time I will not leave a message and to be available by phone to be checked in. Advised I will not leave any more messages on her phone and if she misses [place] to call the office to reschedule.  11am no answer

## 2019-10-19 NOTE — Progress Notes (Signed)
Patient ID: Regina Cross, female   DOB: [birth year], 44 y.o.   MRN: [id number]  Patient did not keep appt; she should be rescheduled.

## 2019-10-23 ENCOUNTER — Other Ambulatory Visit: Payer: Self-pay

## 2019-10-24 ENCOUNTER — Other Ambulatory Visit: Payer: Self-pay

## 2019-12-07 IMAGING — US US MFM OB FOLLOW-UP
1 series · 13 of 28 positions shown · non-contrast
Comparison: none

[Series 1: us mfm ob follow-up · 13 of 87 slices shown]
[im 4/87]
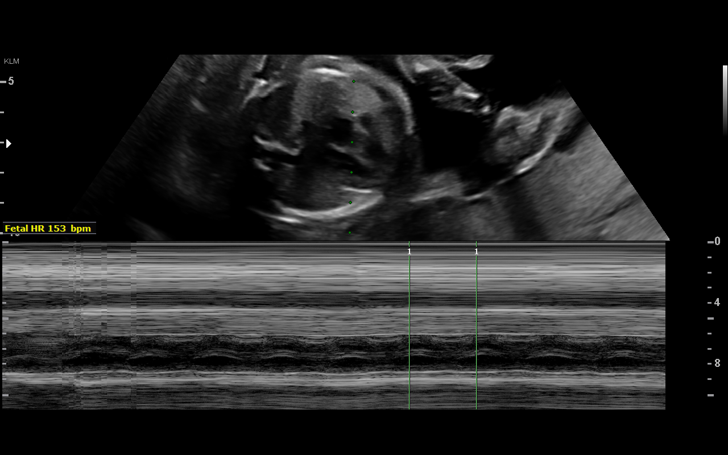
[im 10/87]
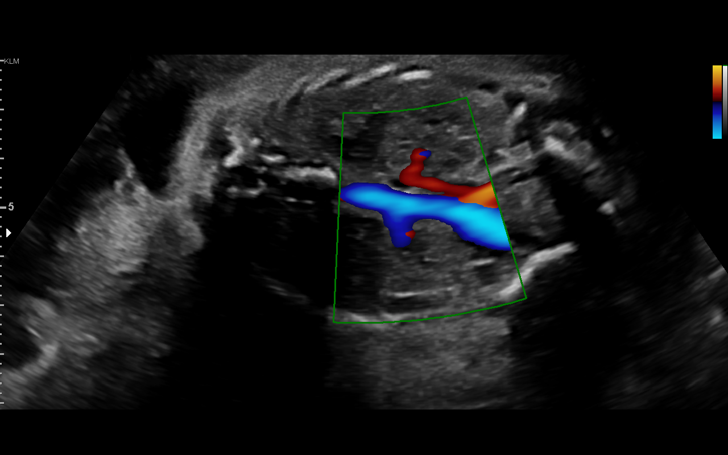
[im 16/87]
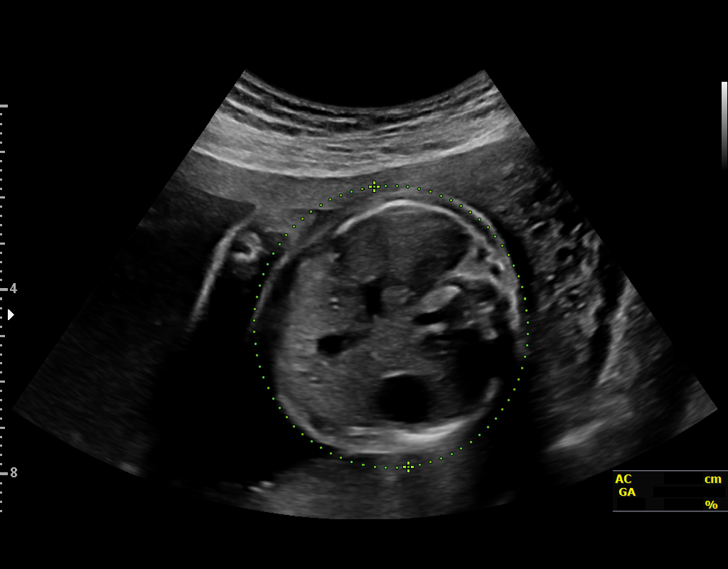
[im 23/87]
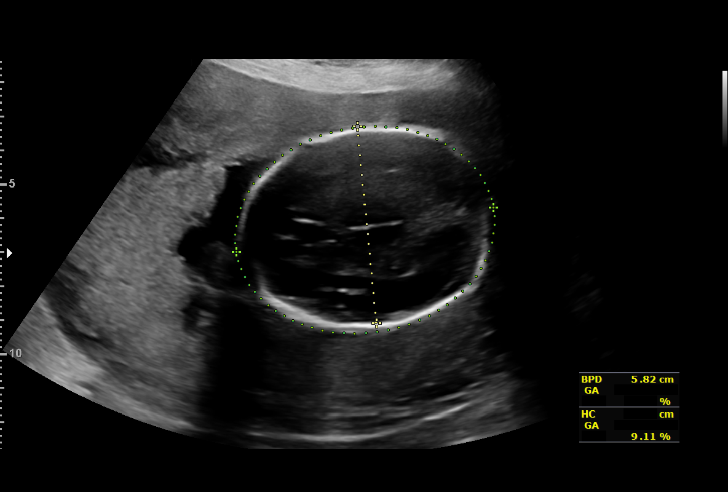
[im 29/87]
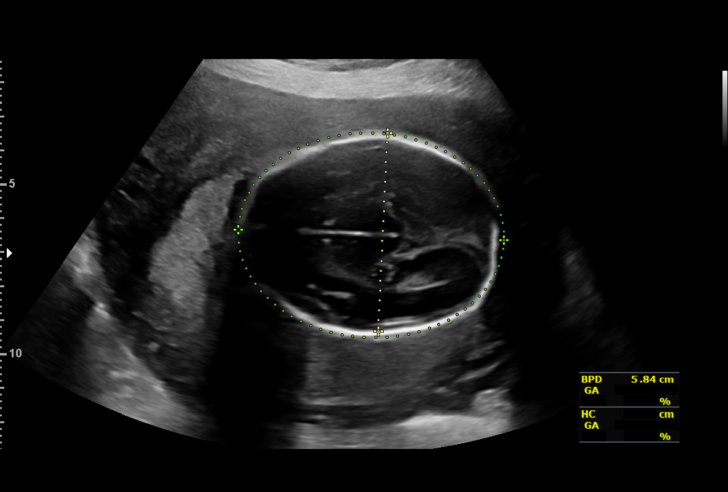
[im 36/87]
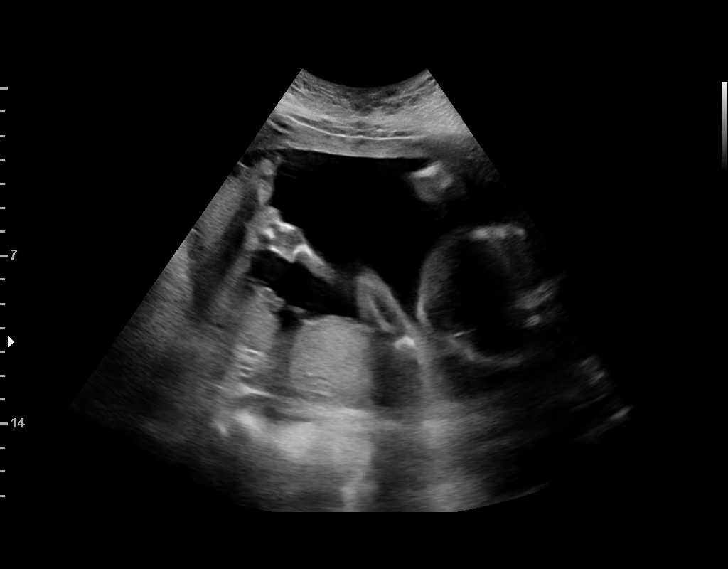
[im 45/87]
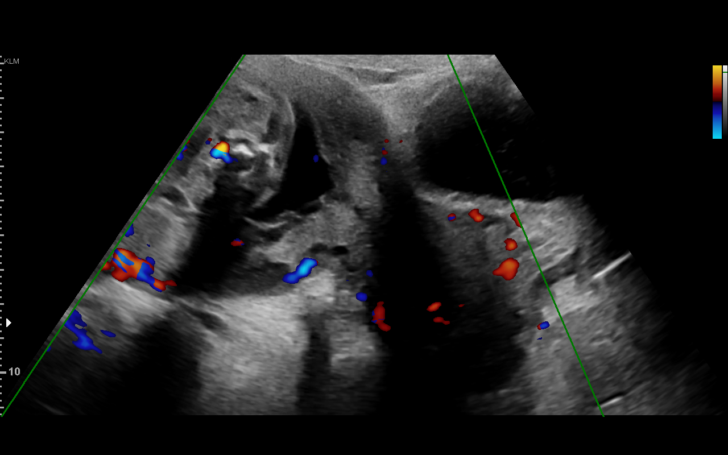
[im 51/87]
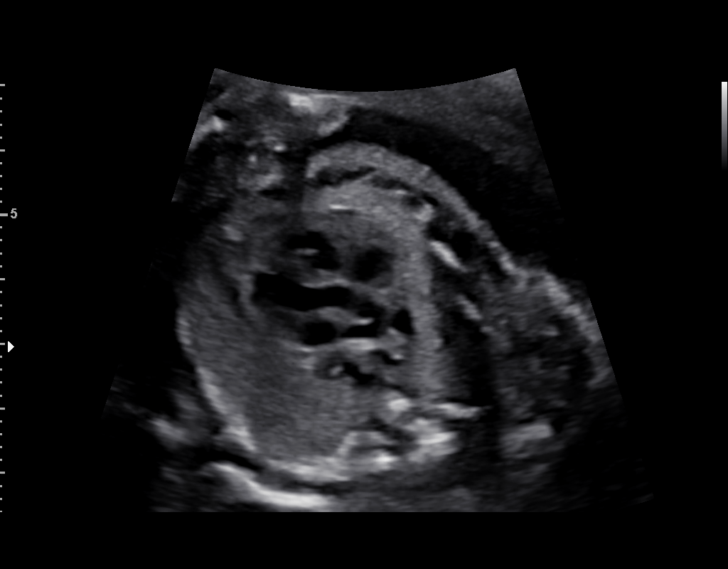
[im 58/87]
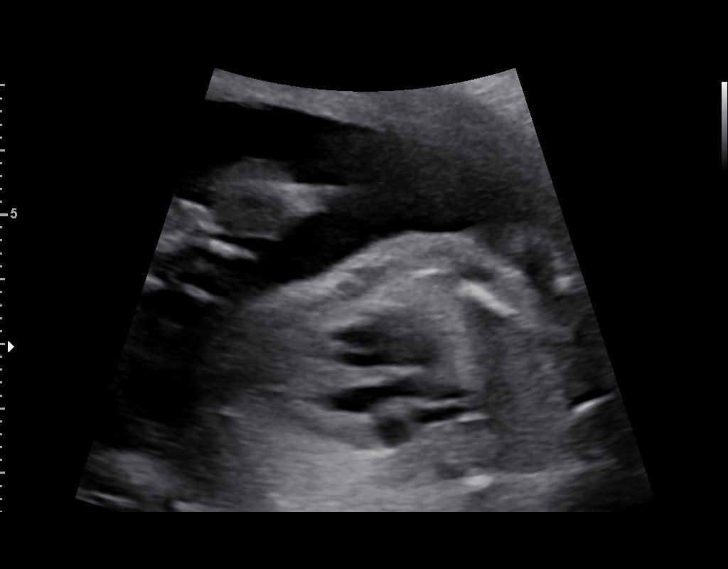
[im 64/87]
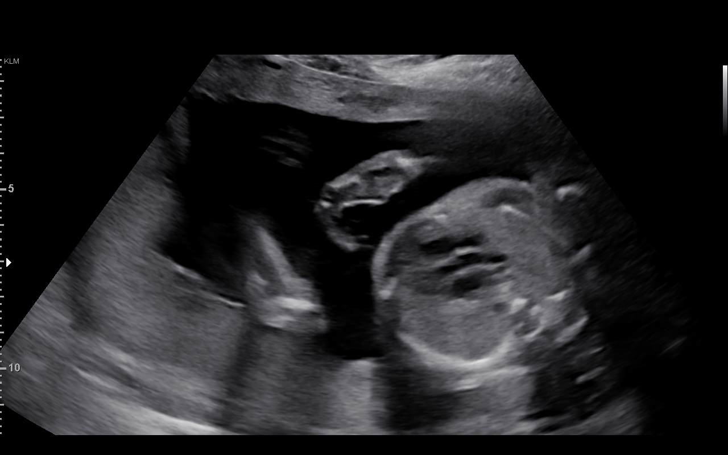
[im 71/87]
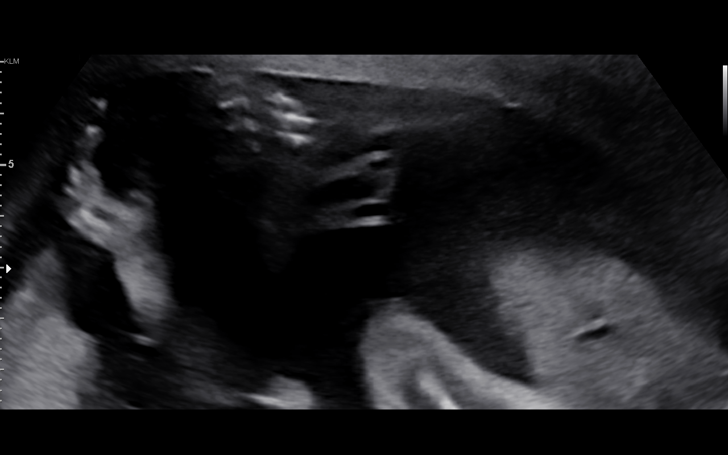
[im 77/87]
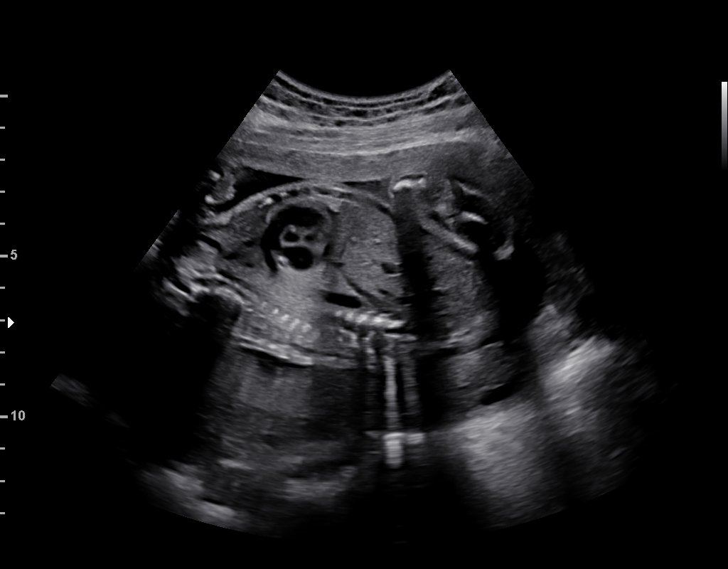
[im 83/87]
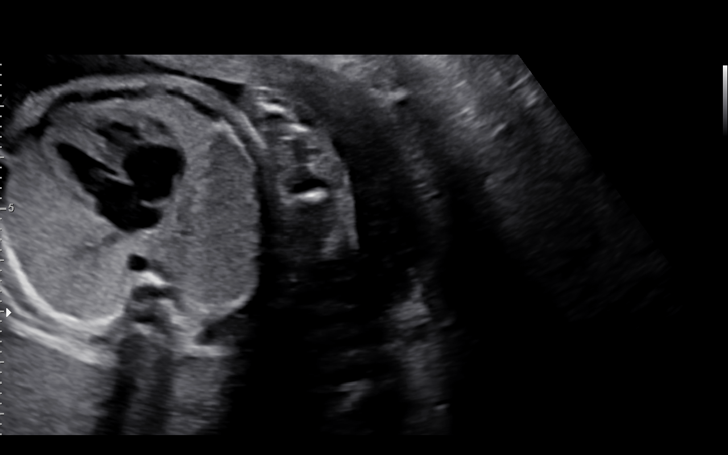

[13 of 28 positions shown; findings below may reference images not displayed]

[address]

 ----------------------------------------------------------------------

 ----------------------------------------------------------------------
Indications

  Advanced maternal age multigravida 35+,
  second trimester
  Fetal abnormality - other known or
  suspected (cystic hygroma) (low risk NIPS)
  (pt r/s ECHO for [DATE])
  Low lying placenta, antepartum
  Antenatal follow-up for nonvisualized fetal
  anatomy
  24 weeks gestation of pregnancy
 ----------------------------------------------------------------------
Fetal Evaluation

 Num Of Fetuses:         1
 Fetal Heart Rate(bpm):  153
 Cardiac Activity:       Observed
 Presentation:           Frank breech
 Placenta:               Posterior Previa
 P. Cord Insertion:      Visualized, central

 Amniotic Fluid
 AFI FV:      Within normal limits
Biometry

 BPD:      58.3  mm     G. Age:  23w 6d         19  %    CI:        71.47   %    70 - 86
                                                         FL/HC:      19.4   %    18.7 -
 HC:      219.6  mm     G. Age:  24w 0d         13  %    HC/AC:      1.16        1.04 -
 AC:      189.3  mm     G. Age:  23w 5d         17  %    FL/BPD:     72.9   %    71 - 87
 FL:       42.5  mm     G. Age:  23w 6d         18  %    FL/AC:      22.5   %    20 - 24

 Est. FW:     630  gm      1 lb 6 oz     13  %
OB History
 Gravidity:    6         Term:   3        Prem:   0        SAB:   2
 TOP:          0       Ectopic:  0        Living: 3
Gestational Age

 LMP:           24w 4d        Date:  12/07/18                 EDD:   09/13/19
 U/S Today:     23w 6d                                        EDD:   09/18/19
 Best:          24w 4d     Det. By:  LMP  (12/07/18)          EDD:   09/13/19
Anatomy

 Cranium:               Appears normal         Aortic Arch:            Previously seen
 Cavum:                 Appears normal         Ductal Arch:            Previously seen
 Ventricles:            Appears normal         Diaphragm:              Appears normal
 Choroid Plexus:        Appears normal         Stomach:                Appears normal, left
                                                                       sided
 Cerebellum:            Appears normal         Abdomen:                Appears normal
 Posterior Fossa:       Appears normal         Abdominal Wall:         Previously seen
 Nuchal Fold:           Not applicable (>20    Cord Vessels:           Previously seen
                        wks GA)
 Face:                  Orbits and profile     Kidneys:                Appear normal
                        previously seen
 Lips:                  Appears normal         Bladder:                Appears normal
 Thoracic:              Appears normal         Spine:                  Previously seen
 Heart:                 Appears normal         Upper Extremities:      Previously seen
                        (4CH, axis, and
                        situs)
 RVOT:                  Appears normal         Lower Extremities:      Previously seen
 LVOT:                  Appears normal

 Other:  Heels/feet and hands/5th digits previously visualized. Nasal bone
         previously visualized. Technically difficult due to fetal position.
Cervix Uterus Adnexa

 Cervix
 Normal appearance by transabdominal scan.
Comments

 This patient was seen for a follow-up exam due to advanced
 maternal age and a cystic hygroma that was noted during her
 first trimester ultrasound.  The patient denies any problems
 since her last exam.  She missed her fetal echocardiogram
 appointment.  She has rescheduled her fetal echocardiogram
 appointment for sometime next week.  She has declined an
 amniocentesis for definitive diagnosis of fetal aneuploidy.

 On today's exam, the fetal growth and amniotic fluid level
 appeared appropriate for her gestational age.

 The views of the fetal heart and fetal lips all appeared within
 normal limits.  The limitations of ultrasound in the detection of
 all anomalies was discussed.

 A posterior placenta previa continues to be noted.  The
 patient was reassured that most cases of placenta previa will
 resolve by the time of delivery.

 A follow-up exam was scheduled in 4 weeks for assessment
 of the fetal growth and placental location.

## 2020-01-04 IMAGING — US US MFM OB FOLLOW-UP
1 series · 13 of 28 positions shown · non-contrast
Comparison: none

[Series 1: us mfm ob follow-up · 72 acquisitions, 13 frames shown]
[im 3/72]
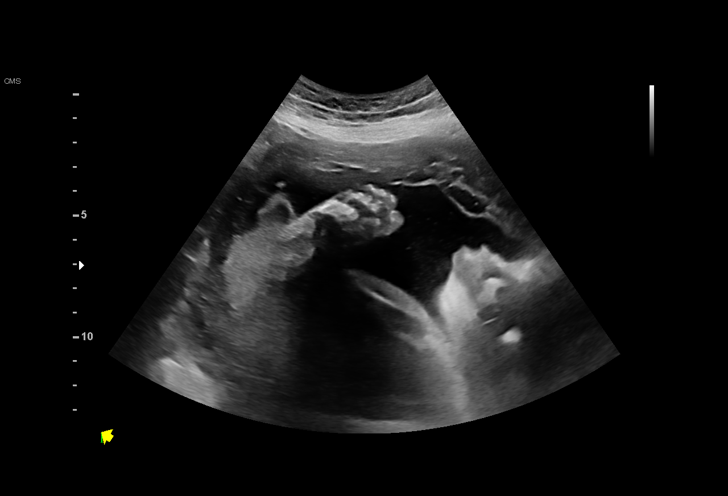
[im 8/72]
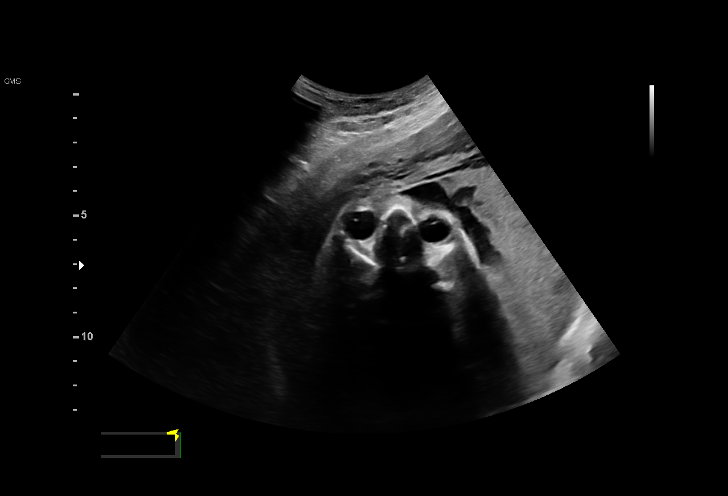
[im 14/72]
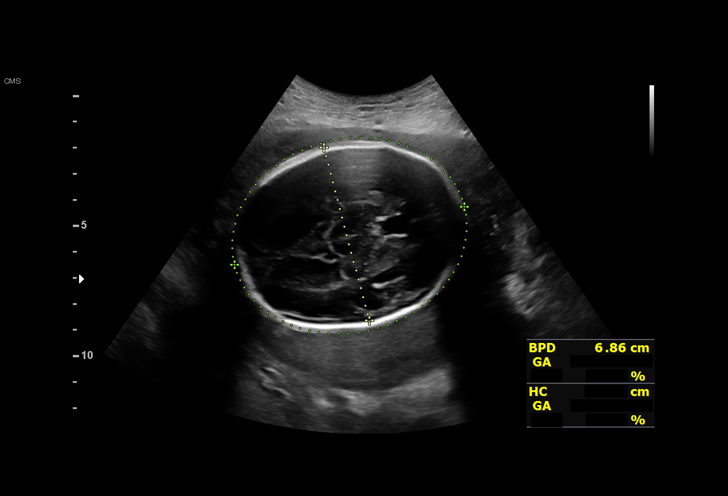
[im 19/72]
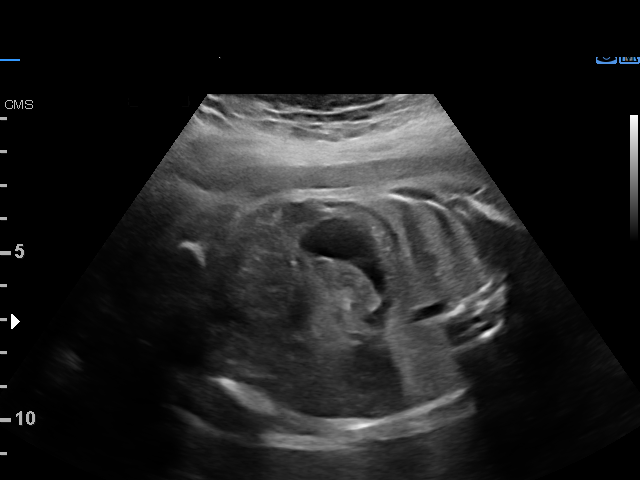
[im 24/72]
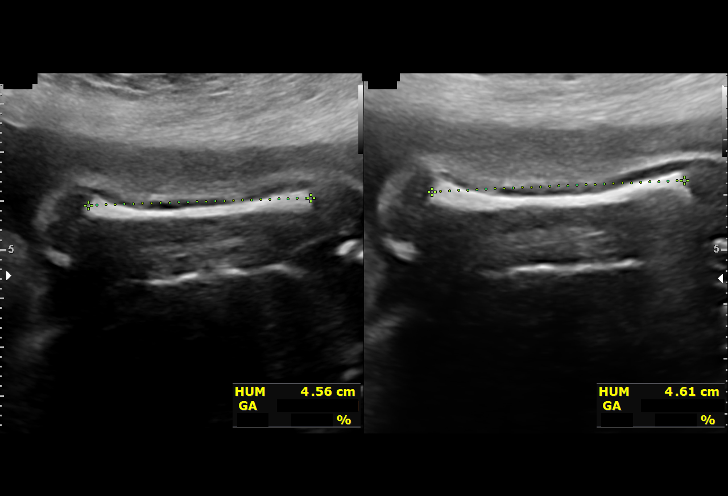
[im 29/72]
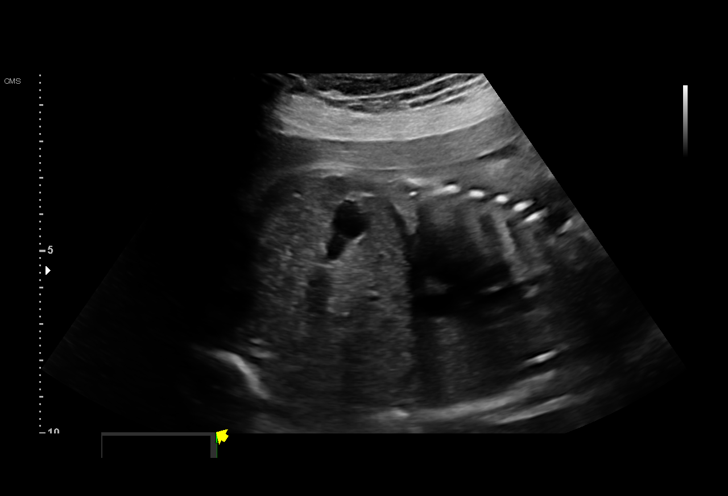
[im 37/72]
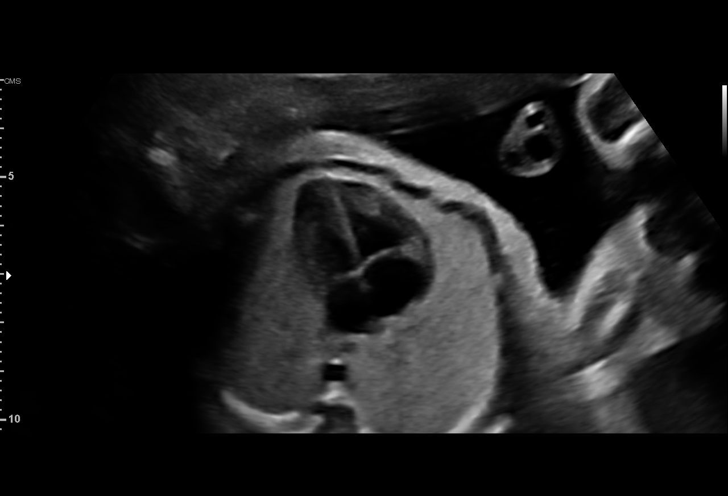
[im 43/72]
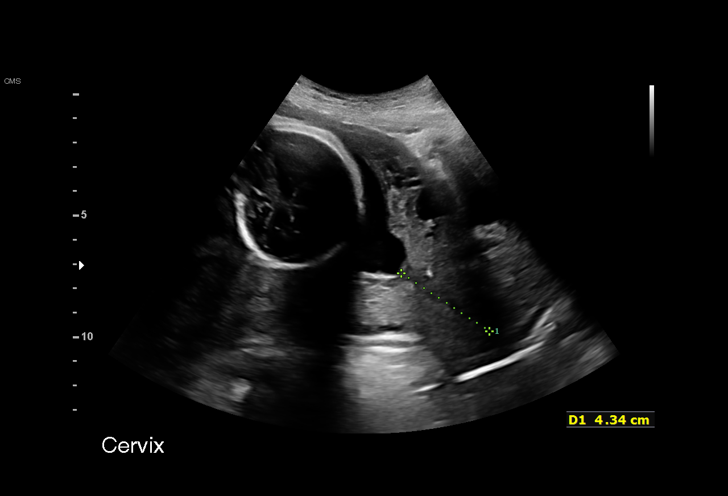
[im 48/72]
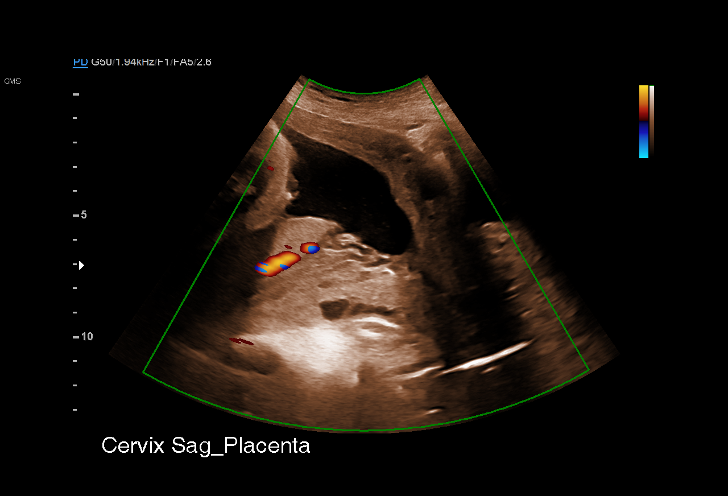
[im 53/72]
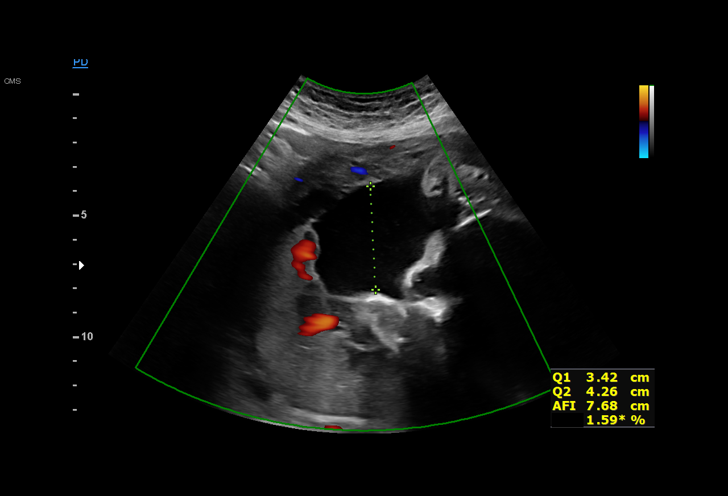
[im 58/72]
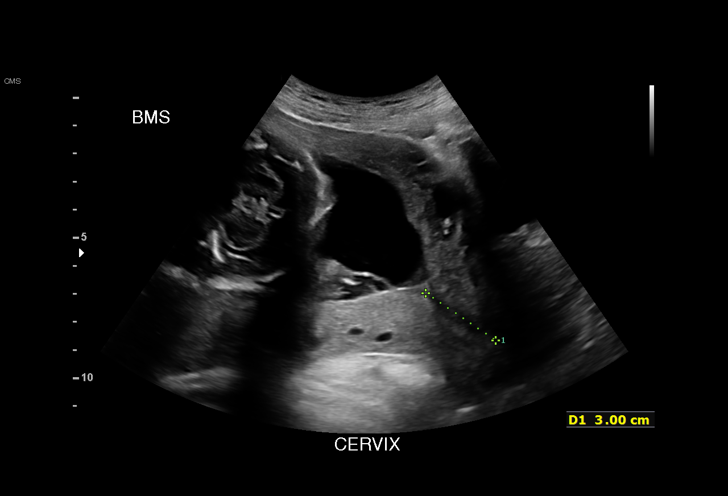
[im 64/72]
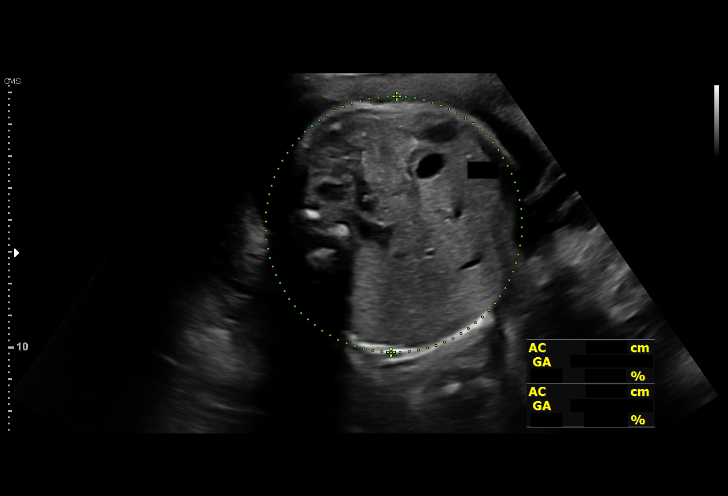
[im 69/72]
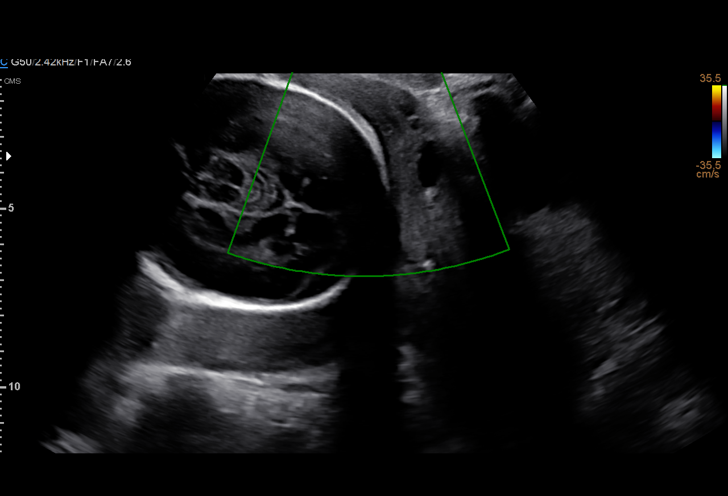

[13 of 28 positions shown; findings below may reference images not displayed]

[address]

 ----------------------------------------------------------------------

 ----------------------------------------------------------------------
Indications

  Advanced maternal age multigravida 35+,
  third trimester
  Encounter for other antenatal screening
  follow-up
  Fetal abnormality - other known or
  suspected (cystic hygroma) (low risk NIPS)
  Low lying placenta, antepartum
  28 weeks gestation of pregnancy
 ----------------------------------------------------------------------
Fetal Evaluation

 Num Of Fetuses:         1
 Fetal Heart Rate(bpm):  153
 Cardiac Activity:       Observed
 Presentation:           Cephalic
 Placenta:               Posterior
 P. Cord Insertion:      Previously Visualized

 Amniotic Fluid
 AFI FV:      Within normal limits

 AFI Sum(cm)     %Tile       Largest Pocket(cm)
 13.6            42

 RUQ(cm)       RLQ(cm)       LUQ(cm)        LLQ(cm)

Biometry

 BPD:      67.5  mm     G. Age:  27w 1d          6  %    CI:        72.13   %    70 - 86
                                                         FL/HC:      20.6   %    19.6 -
 HC:      252.9  mm     G. Age:  27w 3d          3  %    HC/AC:      1.02        0.99 -
 AC:      247.4  mm     G. Age:  29w 0d         56  %    FL/BPD:     77.0   %    71 - 87
 FL:         52  mm     G. Age:  27w 5d         15  %    FL/AC:      21.0   %    20 - 24
 HUM:      45.7  mm     G. Age:  27w 0d         11  %
 Est. FW:    9081  gm    2 lb 10 oz      27  %
OB History

 Gravidity:    6         Term:   3        Prem:   0        SAB:   2
 TOP:          0       Ectopic:  0        Living: 3
Gestational Age

 LMP:           28w 4d        Date:  12/07/18                 EDD:   09/13/19
 U/S Today:     27w 6d                                        EDD:   09/18/19
 Best:          28w 4d     Det. By:  LMP  (12/07/18)          EDD:   09/13/19
Anatomy

 Cranium:               Appears normal         Aortic Arch:            Previously seen
 Cavum:                 Appears normal         Ductal Arch:            Previously seen
 Ventricles:            Appears normal         Diaphragm:              Previously seen
 Choroid Plexus:        Appears normal         Stomach:                Appears normal, left
                                                                       sided
 Cerebellum:            Appears normal         Abdomen:                Appears normal
 Posterior Fossa:       Appears normal         Abdominal Wall:         Previously seen
 Nuchal Fold:           Not applicable (>20    Cord Vessels:           Previously seen
                        wks GA)
 Face:                  Appears normal         Kidneys:                Appear normal
                        (orbits and profile)
 Lips:                  Appears normal         Bladder:                Appears normal
 Thoracic:              Appears normal         Spine:                  Previously seen
 Heart:                 Appears normal         Upper Extremities:      Previously seen
                        (4CH, axis, and
                        situs)
 RVOT:                  Appears normal         Lower Extremities:      Previously seen
 LVOT:                  Appears normal

 Other:  Heels/feet and hands/5th digits previously visualized. Nasal bone
         previously visualized. Technically difficult due to fetal position.
Cervix Uterus Adnexa

 Cervix
 Normal appearance by transabdominal scan.

 Uterus
 No abnormality visualized.

 Left Ovary
 Not visualized.

 Right Ovary
 Not visualized.

 Cul De Sac
 No free fluid seen.

 Adnexa
 No abnormality visualized.
Comments

 This patient was seen for a follow up growth scan due to
 advanced maternal age and placenta previa that was noted
 during her prior ultrasound exams.  She denies any problems
 since her last exam.
 She was informed that the fetal growth and amniotic fluid
 level appears appropriate for her gestational age.
 The placenta previa has resolved on today's exam.  The
 patient was advised that she may attempt a vaginal delivery
 at term should she desire.
 As she is over the age of 40, we will start weekly fetal testing
 at around 32 weeks.  A follow-up exam was scheduled at that
 time.

## 2020-08-11 ENCOUNTER — Ambulatory Visit (HOSPITAL_COMMUNITY)
Admission: EM | Admit: 2020-08-11 | Discharge: 2020-08-11 | Disposition: A | Discharge status: Home / Self Care | Payer: Medicaid Other

## 2020-08-11 ENCOUNTER — Ambulatory Visit (HOSPITAL_COMMUNITY)
Admission: EM | Admit: 2020-08-11 | Discharge: 2020-08-11 | Discharge status: Against Medical Advice | Payer: Medicaid Other

## 2020-08-11 ENCOUNTER — Other Ambulatory Visit: Payer: Self-pay

## 2020-08-15 ENCOUNTER — Ambulatory Visit (HOSPITAL_COMMUNITY)
Admission: EM | Admit: 2020-08-15 | Discharge: 2020-08-15 | Disposition: A | Discharge status: Home / Self Care | Payer: Medicaid Other | Attending: Internal Medicine | Admitting: Internal Medicine

## 2020-08-15 ENCOUNTER — Encounter (HOSPITAL_COMMUNITY): Payer: Self-pay | Admitting: Emergency Medicine

## 2020-08-15 DIAGNOSIS — L02213 Cutaneous abscess of chest wall: Secondary | ICD-10-CM

## 2020-08-15 MED ORDER — SULFAMETHOXAZOLE-TRIMETHOPRIM 800-160 MG PO TABS: 1.0000 | ORAL_TABLET | Freq: Two times a day (BID) | ORAL | 0 refills | Status: DC

## 2020-08-15 MED ORDER — LIDOCAINE-EPINEPHRINE 1 %-1:100000 IJ SOLN
INTRAMUSCULAR | Status: AC
  Filled 2020-08-15: qty 1

## 2020-08-15 NOTE — ED Triage Notes (Signed)
PT C/O: here for abscess under left breast onset 5 days ... sx include swelling and redness.... reports it drained 3 days ago  DENIES: f/n/v/d  A&O x4... NAD... Ambulatory

## 2020-08-15 NOTE — Discharge Instructions (Signed)
Daily dressing changes Heating pad use Antibiotics as prescribed Tylenol/Motrin as needed for pain Return to urgent care to be reevaluated if you notice increased swelling, increased redness or worsening pain.

## 2020-08-17 DIAGNOSIS — L02213 Cutaneous abscess of chest wall: Secondary | ICD-10-CM | POA: Diagnosis not present

## 2020-08-17 NOTE — ED Provider Notes (Signed)
[facility]    CSN: [id number] Arrival date & time: 08/15/20  1746      History   Chief Complaint Chief Complaint  Patient presents with  . Abscess    HPI Regina Cross is a 45 y.o. female comes to the urgent care with a 5-day history of painful swelling on the chest.  Swelling started insidiously 5 days ago and has progressively worsened.  Couple of days ago patient noticed purulent drainage from area no fever or chills.  Patient currently has constant throbbing pain of moderate severity.  Pain is aggravated by palpation.  No known relieving factors.  No trauma to the chest or falls.   HPI  Past Medical History:  Diagnosis Date  . Gestational diabetes     Patient Active Problem List   Diagnosis Date Noted  . Encounter for induction of labor 09/14/2019  . Transient hypertension of pregnancy in third trimester 08/01/2019  . Gestational diabetes mellitus (GDM) affecting pregnancy 06/19/2019  . Patient noncompliance 05/02/2019  . AMA (advanced maternal age) multigravida 35+ 04/18/2019  . Abnormal fetal ultrasound 02/23/2019  . Supervision of high risk pregnancy, antepartum 01/30/2019    Past Surgical History:  Procedure Laterality Date  . NO PAST SURGERIES      OB History    Gravida  6   Para  4   Term  4   Preterm      AB  2   Living  4     SAB  2   IAB      Ectopic      Multiple  0   Live Births  4            Home Medications    Prior to Admission medications   Medication Sig Start Date End Date Taking? Authorizing Provider  sulfamethoxazole-trimethoprim (BACTRIM DS) 800-160 MG tablet Take 1 tablet by mouth 2 (two) times daily for 7 days. 08/15/20 08/22/20 Yes Taylon Louison, Myrene Galas, MD  acetaminophen (TYLENOL) 325 MG tablet Take 2 tablets (650 mg total) by mouth every 6 (six) hours as needed (for pain scale < 4). 09/16/19   Mallie Snooks C, CNM  benzocaine-Menthol (DERMOPLAST) 20-0.5 % AERO Apply 1 application topically as  needed for irritation (perineal discomfort). 09/16/19   Darlina Rumpf, CNM  ibuprofen (ADVIL) 600 MG tablet Take 1 tablet (600 mg total) by mouth every 8 (eight) hours as needed for mild pain. 09/16/19   Darlina Rumpf, CNM  norethindrone (MICRONOR) 0.35 MG tablet Take 1 tablet (0.35 mg total) by mouth daily. Start at 4 weeks postpartum (10/14/2019) 09/16/19   Darlina Rumpf, CNM  Prenatal Vit-Fe Fumarate-FA (MULTIVITAMIN-PRENATAL) 27-0.8 MG TABS tablet Take 1 tablet by mouth daily at 12 noon. 08/20/19   Aletha Halim, MD    Family History Family History  Problem Relation Age of Onset  . Hypertension Mother     Social History Social History   Tobacco Use  . Smoking status: Former Smoker    Packs/day: 0.25    Types: Cigarettes    Quit date: 01/30/2019    Years since quitting: 1.5  . Smokeless tobacco: Never Used  Vaping Use  . Vaping Use: Never used  Substance Use Topics  . Alcohol use: Not Currently  . Drug use: Not Currently    Types: Marijuana    Comment: none since end of June     Allergies   Patient has no known allergies.   Review of Systems Review of Systems  Cardiovascular: Positive for chest pain.  Gastrointestinal: Negative.   Musculoskeletal: Negative for joint swelling and myalgias.  Skin: Positive for color change and wound.     Physical Exam Triage Vital Signs ED Triage Vitals  Enc Vitals Group     BP 08/15/20 1823 115/60     Pulse Rate 08/15/20 1823 62     Resp 08/15/20 1823 14     Temp 08/15/20 1823 98.3 F (36.8 C)     Temp Source 08/15/20 1823 Oral     SpO2 08/15/20 1823 98 %     Weight --      Height --      Head Circumference --      Peak Flow --      Pain Score 08/15/20 1821 2     Pain Loc --      Pain Edu? --      Excl. in GC? --    No data found.  Updated Vital Signs BP 115/60 (BP Location: Right Arm)   Pulse 62   Temp 98.3 F (36.8 C) (Oral)   Resp 14   LMP 07/28/2020   SpO2 98%   Breastfeeding No    Visual Acuity Right Eye Distance:   Left Eye Distance:   Bilateral Distance:    Right Eye Near:   Left Eye Near:    Bilateral Near:     Physical Exam Vitals and nursing note reviewed.  Constitutional:      General: She is not in acute distress.    Appearance: She is not diaphoretic.  Cardiovascular:     Rate and Rhythm: Normal rate and regular rhythm.     Pulses: Normal pulses.     Heart sounds: Normal heart sounds.  Pulmonary:     Effort: Pulmonary effort is normal.     Breath sounds: Normal breath sounds.  Musculoskeletal:        General: Swelling and tenderness present. No deformity or signs of injury.  Skin:    Capillary Refill: Capillary refill takes less than 2 seconds.     Coloration: Skin is not jaundiced or pale.     Findings: Erythema and lesion present. No bruising.  Neurological:     Mental Status: She is alert.      UC Treatments / Results  Labs (all labs ordered are listed, but only abnormal results are displayed) Labs Reviewed - No data to display  EKG   Radiology No results found.  Procedures Incision and Drainage  Date/Time: 08/17/2020 8:45 PM Performed by: Merrilee Jansky, MD Authorized by: Merrilee Jansky, MD   Consent:    Consent obtained:  Verbal   Consent given by:  Patient   Risks, benefits, and alternatives were discussed: yes     Risks discussed:  Bleeding and incomplete drainage   Alternatives discussed:  Observation Universal protocol:    Patient identity confirmed:  Verbally with patient Location:    Type:  Abscess   Location:  Trunk   Trunk location:  Chest Pre-procedure details:    Skin preparation:  Povidone-iodine Anesthesia:    Anesthesia method:  Local infiltration   Local anesthetic:  Lidocaine 1% WITH epi Procedure type:    Complexity:  Simple Procedure details:    Ultrasound guidance: no     Needle aspiration: yes     Needle size:  18 G   Incision types:  Single straight   Incision depth:   Subcutaneous   Wound management:  Extensive cleaning   Drainage:  Purulent   Drainage amount:  Moderate   Wound treatment:  Wound left open   Packing materials:  None Post-procedure details:    Procedure completion:  Tolerated well, no immediate complications   (including critical care time)  Medications Ordered in UC Medications - No data to display  Initial Impression / Assessment and Plan / UC Course  I have reviewed the triage vital signs and the nursing notes.  Pertinent labs & imaging results that were available during my care of the patient were reviewed by me and considered in my medical decision making (see chart for details).     1.  Abscess of chest wall: Incision and drainage done Warm compress Bactrim twice daily for 7 days Tylenol or Motrin as needed for pain Return precautions given Final Clinical Impressions(s) / UC Diagnoses   Final diagnoses:  Abscess of chest wall     Discharge Instructions     Daily dressing changes Heating pad use Antibiotics as prescribed Tylenol/Motrin as needed for pain Return to urgent care to be reevaluated if you notice increased swelling, increased redness or worsening pain.   ED Prescriptions    Medication Sig Dispense Auth. Provider   sulfamethoxazole-trimethoprim (BACTRIM DS) 800-160 MG tablet Take 1 tablet by mouth 2 (two) times daily for 7 days. 14 tablet Alecsander Hattabaugh, Britta Mccreedy, MD     PDMP not reviewed this encounter.   Merrilee Jansky, MD 08/17/20 [phone]

## 2020-08-20 ENCOUNTER — Telehealth: Payer: Medicaid Other | Admitting: Nurse Practitioner

## 2020-08-20 DIAGNOSIS — R0602 Shortness of breath: Secondary | ICD-10-CM

## 2020-08-20 DIAGNOSIS — Z20822 Contact with and (suspected) exposure to covid-19: Secondary | ICD-10-CM

## 2020-08-20 MED ORDER — ALBUTEROL SULFATE HFA 108 (90 BASE) MCG/ACT IN AERS: 2.0000 | INHALATION_SPRAY | Freq: Four times a day (QID) | RESPIRATORY_TRACT | 0 refills | Status: DC | PRN

## 2020-08-20 NOTE — Progress Notes (Signed)
E-Visit for Corona Virus Screening  Your current symptoms could be consistent with the coronavirus.  Many health care providers can now test patients at their office but not all are.  [place] has multiple testing sites. For information on our COVID testing locations and hours go to [email or web address]  * nothing more you can do at this point as far as your kids are concerned. You have probably exposed them anyway if your covid comes back positive. Wearing a mask always helps.   Testing Information: The COVID-19 Community Testing sites are testing BY APPOINTMENT ONLY.  You can schedule online at [email or web address]  If you do not have access to a smart phone or computer you may call [phone] for an appointment.   Additional testing sites in the Community:  . For [facility] Testing sites in [facility]  FarmerBuys.com.[email or web address]  . For Pop-up testing sites in [place]  [email or web address]  . For [facility] [email or web address].dk  . For [facility] testing in [place] and [employer] [email or web address]  . For [facility] testing in [facility]   [email or web address]  For  more information about community testing call [phone]   Please quarantine yourself while awaiting your test results. Please stay home for a minimum of 10 days from the first day of illness with improving symptoms and you have had 24 hours of no fever (without the use of Tylenol (Acetaminophen) Motrin (Ibuprofen) or any fever reducing medication).  Also - Do not get tested prior to returning to work because once you have had a positive test the test can stay positive for more than a  month in some cases.   You should wear a mask or cloth face covering over your nose and mouth if you must be around other people or animals, including pets (even at home). Try to stay at least 6 feet away from other people. This will protect the people around you.  Please continue good preventive care measures, including:  frequent hand-washing, avoid touching your face, cover coughs/sneezes, stay out of crowds and keep a 6 foot distance from others.  COVID-19 is a respiratory illness with symptoms that are similar to the flu. Symptoms are typically mild to moderate, but there have been cases of severe illness and death due to the virus.   The following symptoms may appear 2-14 days after exposure: . Fever . Cough . Shortness of breath or difficulty breathing . Chills . Repeated shaking with chills . Muscle pain . Headache . Sore throat . New loss of taste or smell . Fatigue . Congestion or runny nose . Nausea or vomiting . Diarrhea  Go to the nearest hospital ED for assessment if fever/cough/breathlessness are severe or illness seems like a threat to life.  It is vitally important that if you feel that you have an infection such as this virus or any other virus that you stay home and away from places where you may spread it to others.  You should avoid contact with people age 27 and older.   You can use medication such as A prescription inhaler called Albuterol MDI 90 mcg /actuation 2 puffs every 4 hours as needed for shortness of breath, wheezing, cough  You may also take acetaminophen (Tylenol) as needed for fever.  Reduce your risk of any infection by using the same precautions used for avoiding the common cold or flu:  Marland Kitchen Wash your hands often with soap and warm water for at least 20 seconds.  If soap and water  are not readily available, use an alcohol-based hand sanitizer with at least 60% alcohol.  . If coughing or sneezing, cover your mouth and nose by coughing or sneezing into the  elbow areas of your shirt or coat, into a tissue or into your sleeve (not your hands). . Avoid shaking hands with others and consider head nods or verbal greetings only. . Avoid touching your eyes, nose, or mouth with unwashed hands.  . Avoid close contact with people who are sick. . Avoid places or events with large numbers of people in one location, like concerts or sporting events. . Carefully consider travel plans you have or are making. . If you are planning any travel outside or inside the [place], visit the CDC's Travelers' Health webpage for the latest health notices. . If you have some symptoms but not all symptoms, continue to monitor at home and seek medical attention if your symptoms worsen. . If you are having a medical emergency, call 911.  HOME CARE . Only take medications as instructed by your medical team. . Drink plenty of fluids and get plenty of rest. . A steam or ultrasonic humidifier can help if you have congestion.   GET HELP RIGHT AWAY IF YOU HAVE EMERGENCY WARNING SIGNS** FOR COVID-19. If you or someone is showing any of these signs seek emergency medical care immediately. Call 911 or proceed to your closest emergency facility if: . You develop worsening high fever. . Trouble breathing . Bluish lips or face . Persistent pain or pressure in the chest . New confusion . Inability to wake or stay awake . You cough up blood. . Your symptoms become more severe  **This list is not all possible symptoms. Contact your medical provider for any symptoms that are sever or concerning to you.  MAKE SURE YOU   Understand these instructions.  Will watch your condition.  Will get help right away if you are not doing well or get worse.  Your e-visit answers were reviewed by a board certified advanced clinical practitioner to complete your personal care plan.  Depending on the condition, your plan could have included both over the counter or prescription medications.  If there is  a problem please reply once you have received a response from your provider.  Your safety is important to [place].  If you have drug allergies check your prescription carefully.    You can use MyChart to ask questions about today's visit, request a non-urgent call back, or ask for a work or school excuse for 24 hours related to this e-Visit. If it has been greater than 24 hours you will need to follow up with your provider, or enter a new e-Visit to address those concerns. You will get an e-mail in the next two days asking about your experience.  I hope that your e-visit has been valuable and will speed your recovery. Thank you for using e-visits.   5-10 minutes spent reviewing and documenting in chart.

## 2020-08-21 ENCOUNTER — Other Ambulatory Visit: Payer: Self-pay

## 2020-08-21 ENCOUNTER — Encounter (HOSPITAL_COMMUNITY): Payer: Self-pay | Admitting: Emergency Medicine

## 2020-08-21 ENCOUNTER — Ambulatory Visit (HOSPITAL_COMMUNITY)
Admission: EM | Admit: 2020-08-21 | Discharge: 2020-08-21 | Disposition: A | Discharge status: Home / Self Care | Payer: Medicaid Other | Attending: Family Medicine | Admitting: Family Medicine

## 2020-08-21 DIAGNOSIS — Z20822 Contact with and (suspected) exposure to covid-19: Secondary | ICD-10-CM | POA: Insufficient documentation

## 2020-08-21 DIAGNOSIS — R519 Headache, unspecified: Secondary | ICD-10-CM

## 2020-08-21 MED ORDER — DOXYCYCLINE HYCLATE 100 MG PO CAPS: 100.0000 mg | ORAL_CAPSULE | Freq: Two times a day (BID) | ORAL | 0 refills | Status: DC

## 2020-08-21 NOTE — ED Triage Notes (Signed)
Pt here for COVID exposure ... reports boyfriend tested positive for COVID yesterday  Sx today include headache  Denies f/v/n/d, cough  A&O x4... NAD.Marland Kitchen. ambulatory

## 2020-08-21 NOTE — Discharge Instructions (Addendum)
You have been tested for COVID-19 today. °If your test returns positive, you will receive a phone call from [place] regarding your results. °Negative test results are not called. °Both positive and negative results area always visible on MyChart. °If you do not have a MyChart account, sign up instructions are provided in your discharge papers. °Please do not hesitate to contact us should you have questions or concerns. ° °

## 2020-08-22 LAB — SARS CORONAVIRUS 2 (TAT 6-24 HRS): SARS Coronavirus 2: NEGATIVE

## 2020-08-26 NOTE — ED Provider Notes (Signed)
  [facility]   [id number] 08/21/20 Arrival Time: 1656  ASSESSMENT & PLAN:  1. Exposure to COVID-19 virus   2. Acute nonintractable headache, unspecified headache type      COVID-19 testing sent. See letter/work note on file for self-isolation guidelines. OTC symptom care as needed.    Reviewed expectations re: course of current medical issues. Questions answered. Outlined signs and symptoms indicating need for more acute intervention. Understanding verbalized. After Visit Summary given.   SUBJECTIVE: History from: patient. Lavoris Denman is a 45 y.o. female who presents with worries regarding COVID-19. Known COVID-19 contact: boyfriend. Recent travel: none. Reports: headache; several days to over one week. Denies: fever and difficulty breathing. Normal PO intake without n/v/d.    OBJECTIVE:  General appearance: alert; no distress Eyes: PERRLA; EOMI; conjunctiva normal HENT: [place]; AT; with nasal congestion Neck: supple  Lungs: speaks full sentences without difficulty; unlabored Extremities: no edema Skin: warm and dry Neurologic: normal gait Psychological: alert and cooperative; normal mood and affect  Labs:  Labs Reviewed  SARS CORONAVIRUS 2 (TAT 6-24 HRS)      Allergies  Allergen Reactions  . Corn-Containing Products Rash    Only baby corn    Past Medical History:  Diagnosis Date  . Gestational diabetes    Social History   Socioeconomic History  . Marital status: Single    Spouse name: Not on file  . Number of children: 3  . Years of education: Not on file  . Highest education level: Not on file  Occupational History  . Not on file  Tobacco Use  . Smoking status: Former Smoker    Packs/day: 0.25    Types: Cigarettes    Quit date: 01/30/2019    Years since quitting: 1.5  . Smokeless tobacco: Never Used  Vaping Use  . Vaping Use: Never used  Substance and Sexual Activity  . Alcohol use: Not Currently  . Drug use: Not Currently     Types: Marijuana    Comment: none since end of June  . Sexual activity: Yes    Birth control/protection: None  Other Topics Concern  . Not on file  Social History Narrative  . Not on file   Social Determinants of Health   Financial Resource Strain: Not on file  Food Insecurity: Not on file  Transportation Needs: Not on file  Physical Activity: Not on file  Stress: Not on file  Social Connections: Not on file  Intimate Partner Violence: Not on file   Family History  Problem Relation Age of Onset  . Hypertension Mother    Past Surgical History:  Procedure Laterality Date  . NO PAST SURGERIES       Mardella Layman, MD 08/26/20 [phone]

## 2020-09-13 ENCOUNTER — Other Ambulatory Visit: Payer: Self-pay | Admitting: Nurse Practitioner

## 2021-01-15 ENCOUNTER — Ambulatory Visit (INDEPENDENT_AMBULATORY_CARE_PROVIDER_SITE_OTHER): Payer: Medicaid Other | Admitting: Primary Care

## 2023-08-24 DIAGNOSIS — F411 Generalized anxiety disorder: Secondary | ICD-10-CM | POA: Diagnosis not present

## 2023-09-07 DIAGNOSIS — F411 Generalized anxiety disorder: Secondary | ICD-10-CM | POA: Diagnosis not present

## 2023-10-02 ENCOUNTER — Emergency Department (HOSPITAL_COMMUNITY): Payer: Medicaid Other

## 2023-10-02 ENCOUNTER — Emergency Department (HOSPITAL_COMMUNITY)
Admission: EM | Admit: 2023-10-02 | Discharge: 2023-10-02 | Disposition: A | Discharge status: Home / Self Care | Payer: Medicaid Other

## 2023-10-02 ENCOUNTER — Encounter (HOSPITAL_COMMUNITY): Payer: Self-pay

## 2023-10-02 ENCOUNTER — Other Ambulatory Visit: Payer: Self-pay

## 2023-10-02 DIAGNOSIS — R059 Cough, unspecified: Secondary | ICD-10-CM | POA: Diagnosis not present

## 2023-10-02 DIAGNOSIS — F17200 Nicotine dependence, unspecified, uncomplicated: Secondary | ICD-10-CM | POA: Insufficient documentation

## 2023-10-02 DIAGNOSIS — M25551 Pain in right hip: Secondary | ICD-10-CM | POA: Insufficient documentation

## 2023-10-02 DIAGNOSIS — J40 Bronchitis, not specified as acute or chronic: Secondary | ICD-10-CM | POA: Diagnosis not present

## 2023-10-02 DIAGNOSIS — R918 Other nonspecific abnormal finding of lung field: Secondary | ICD-10-CM | POA: Diagnosis not present

## 2023-10-02 DIAGNOSIS — M545 Low back pain, unspecified: Secondary | ICD-10-CM | POA: Diagnosis not present

## 2023-10-02 LAB — URINALYSIS, ROUTINE W REFLEX MICROSCOPIC
Bilirubin Urine: NEGATIVE
Glucose, UA: NEGATIVE mg/dL
Hgb urine dipstick: NEGATIVE
Ketones, ur: NEGATIVE mg/dL
Leukocytes,Ua: NEGATIVE
Nitrite: NEGATIVE
Protein, ur: NEGATIVE mg/dL
Specific Gravity, Urine: 1.018 (ref 1.005–1.030)
pH: 7 (ref 5.0–8.0)

## 2023-10-02 LAB — PREGNANCY, URINE: Preg Test, Ur: NEGATIVE

## 2023-10-02 MED ORDER — ALBUTEROL SULFATE HFA 108 (90 BASE) MCG/ACT IN AERS
2.0000 | INHALATION_SPRAY | Freq: Once | RESPIRATORY_TRACT | Status: AC
  Administered 2023-10-02: 2 via RESPIRATORY_TRACT
  Filled 2023-10-02: qty 6.7

## 2023-10-02 MED ORDER — OXYCODONE-ACETAMINOPHEN 5-325 MG PO TABS
1.0000 | ORAL_TABLET | Freq: Once | ORAL | Status: AC
  Administered 2023-10-02: 1 via ORAL
  Filled 2023-10-02: qty 1

## 2023-10-02 MED ORDER — PREDNISONE 50 MG PO TABS: ORAL_TABLET | ORAL | 0 refills | Status: DC

## 2023-10-02 MED ORDER — IPRATROPIUM-ALBUTEROL 0.5-2.5 (3) MG/3ML IN SOLN: 3.0000 mL | Freq: Once | RESPIRATORY_TRACT | Status: DC

## 2023-10-02 MED ORDER — KETOROLAC TROMETHAMINE 15 MG/ML IJ SOLN
15.0000 mg | Freq: Once | INTRAMUSCULAR | Status: AC
  Administered 2023-10-02: 15 mg via INTRAMUSCULAR
  Filled 2023-10-02: qty 1

## 2023-10-02 MED ORDER — METHOCARBAMOL 500 MG PO TABS: 500.0000 mg | ORAL_TABLET | Freq: Two times a day (BID) | ORAL | 0 refills | Status: DC

## 2023-10-02 MED ORDER — AZITHROMYCIN 250 MG PO TABS: 250.0000 mg | ORAL_TABLET | Freq: Every day | ORAL | 0 refills | Status: DC

## 2023-10-02 NOTE — ED Provider Triage Note (Signed)
Emergency Medicine Provider Triage Evaluation Note  Regina Cross , a 48 y.o. female  was evaluated in triage.  Pt complains of low back pain   Review of Systems  Positive: Pain in left low back  Negative: dysurai  Physical Exam  BP 121/67 (BP Location: Right Arm)   Pulse 81   Temp 98.8 F (37.1 C) (Oral)   Resp 20   LMP 09/17/2023 (Approximate)   SpO2 100%  Gen:   Awake, no distress   Resp:  Normal effort  MSK:   Moves extremities without difficulty  Other:    Medical Decision Making  Medically screening exam initiated at 5:10 PM.  Appropriate orders placed.  Janney Priego was informed that the remainder of the evaluation will be completed by another provider, this initial triage assessment does not replace that evaluation, and the importance of remaining in the ED until their evaluation is complete.     Elson Areas, [place] 10/02/23 1710

## 2023-10-02 NOTE — ED Notes (Signed)
Pt refused the initial dose of toradol. Pt states " I want to watch you get it from the machine and draw it up." Pt educated that she was not able to see the "machine" it came from. Another vial of toradol was removed from the pyxis and drawn up in a syringe in front of the pt."

## 2023-10-02 NOTE — ED Provider Notes (Signed)
[place] EMERGENCY DEPARTMENT AT [facility] Provider Note   CSN: [id number] Arrival date & time: 10/02/23  1646     History  Chief Complaint  Patient presents with   Back Pain   Hip Pain    Regina Cross is a 48 y.o. female.  48 year old female with no reported past medical history who does smoke presenting to the emergency department today with pain in her back and right hip.  Patient states has been going now for the past few days.  She reports she is also had a chronic cough now for the past month or so.  She does report she is coughing up some sputum with this.  Denies any associated fevers.  She denies any associated chest pain.  She does report some mild shortness of breath.  She does continue to smoke.  She denies any bowel or bladder dysfunction and has not had any saddle anesthesia.  She does not use any IV drugs.  She came to the ER today for further evaluation regarding this.   Back Pain Hip Pain       Home Medications Prior to Admission medications   Medication Sig Start Date End Date Taking? Authorizing Provider  azithromycin (ZITHROMAX) 250 MG tablet Take 1 tablet (250 mg total) by mouth daily. Take first 2 tablets together, then 1 every day until finished. 10/02/23  Yes Durwin Glaze, MD  methocarbamol (ROBAXIN) 500 MG tablet Take 1 tablet (500 mg total) by mouth 2 (two) times daily. 10/02/23  Yes Durwin Glaze, MD  predniSONE (DELTASONE) 50 MG tablet Take 1 tablet by mouth daily 10/02/23  Yes Durwin Glaze, MD  acetaminophen (TYLENOL) 325 MG tablet Take 2 tablets (650 mg total) by mouth every 6 (six) hours as needed (for pain scale < 4). 09/16/19   Calvert Cantor, CNM  albuterol (VENTOLIN HFA) 108 (90 Base) MCG/ACT inhaler Inhale 2 puffs into the lungs every 6 (six) hours as needed for wheezing or shortness of breath. 08/20/20   Daphine Deutscher, Mary-Margaret, FNP  benzocaine-Menthol (DERMOPLAST) 20-0.5 % AERO Apply 1 application topically as needed for  irritation (perineal discomfort). 09/16/19   Calvert Cantor, CNM  doxycycline (VIBRAMYCIN) 100 MG capsule Take 1 capsule (100 mg total) by mouth 2 (two) times daily. 08/21/20   Mardella Layman, MD  ibuprofen (ADVIL) 600 MG tablet Take 1 tablet (600 mg total) by mouth every 8 (eight) hours as needed for mild pain. 09/16/19   Calvert Cantor, CNM  norethindrone (MICRONOR) 0.35 MG tablet Take 1 tablet (0.35 mg total) by mouth daily. Start at 4 weeks postpartum (10/14/2019) 09/16/19   Calvert Cantor, CNM  Prenatal Vit-Fe Fumarate-FA (MULTIVITAMIN-PRENATAL) 27-0.8 MG TABS tablet Take 1 tablet by mouth daily at 12 noon. 08/20/19   Hambleton Bing, MD      Allergies    Corn-containing products    Review of Systems   Review of Systems  Musculoskeletal:  Positive for back pain.  All other systems reviewed and are negative.   Physical Exam Updated Vital Signs BP 121/67 (BP Location: Right Arm)   Pulse 81   Temp 98.8 F (37.1 C) (Oral)   Resp 20   LMP 09/17/2023 (Approximate)   SpO2 100%  Physical Exam Vitals and nursing note reviewed.   Gen: NAD Eyes: PERRL, EOMI HEENT: no oropharyngeal swelling Neck: trachea midline Resp: Diminished at bilateral lung bases Card: RRR, no murmurs, rubs, or gallops Abd: nontender, nondistended Extremities: no calf tenderness, no edema  Vascular: 2+ radial pulses bilaterally, 2+ DP pulses bilaterally Skin: no rashes Psyc: acting appropriately   ED Results / Procedures / Treatments   Labs (all labs ordered are listed, but only abnormal results are displayed) Labs Reviewed  URINALYSIS, ROUTINE W REFLEX MICROSCOPIC - Abnormal; Notable for the following components:      Result Value   APPearance HAZY (*)    All other components within normal limits  PREGNANCY, URINE    EKG None  Radiology DG Chest 1 View Result Date: 10/02/2023 CLINICAL DATA:  10031 Cough 10031 EXAM: CHEST  1 VIEW COMPARISON:  None Available. FINDINGS: The  cardiomediastinal silhouette is normal in contour.Mild bronchitic markings. No pleural effusion. No pneumothorax. No focal consolidation. IMPRESSION: Mild bronchitic markings which could reflect bronchitis. No focal consolidation. Electronically Signed   By: Meda Klinefelter M.D.   On: 10/02/2023 18:50   DG Lumbar Spine Complete Result Date: 10/02/2023 CLINICAL DATA:  pain EXAM: LUMBAR SPINE - COMPLETE 4+ VIEW COMPARISON:  None Available. FINDINGS: There are five non-rib bearing lumbar-type vertebral bodies with assimilation joints at L5-S1. There is normal alignment. There is no evidence for acute fracture or subluxation. Possible pars defect at L5-S1 on the LEFT. Intervertebral disc spaces are relatively preserved without significant degenerative changes. Visualized abdomen is unremarkable. IMPRESSION: 1. 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 This could reflect a source of pain in the appropriate clinical setting. 2. 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 Electronically Signed   By: Meda Klinefelter M.D.   On: 10/02/2023 18:21    Procedures Procedures    Medications Ordered in ED Medications  oxyCODONE-acetaminophen (PERCOCET/ROXICET) 5-325 MG per tablet 1 tablet (has no administration in time range)  ketorolac (TORADOL) 15 MG/ML injection 15 mg (has no administration in time range)  albuterol (VENTOLIN HFA) 108 (90 Base) MCG/ACT inhaler 2 puff (2 puffs Inhalation Given 10/02/23 1800)    ED Course/ Medical Decision Making/ A&P                                 Medical Decision Making 48 year old female with no reported past medical history who does smoke presenting to emergency department today with chronic cough as well as some low back pain.  Patient's urinalysis here is negative.  Urine pregnancy test is negative as well.  Chest x-ray shows findings concerning for bronchitis.  She was given albuterol here with some improvement.  Her x-ray of her lumbar spine shows assimilation of L5-S1.   The patient is treated with Toradol for pain.  She is discharged with neurosurgery follow-up with return precautions.  She does not have any red flag symptoms for cauda equina syndrome or cord compressing lesion at this time.  Amount and/or Complexity of Data Reviewed Labs: ordered. Radiology: ordered.  Risk Prescription drug management.           Final Clinical Impression(s) / ED Diagnoses Final diagnoses:  Bronchitis  Low back pain, unspecified back pain laterality, unspecified chronicity, unspecified whether sciatica present    Rx / DC Orders ED Discharge Orders          Ordered    predniSONE (DELTASONE) 50 MG tablet        10/02/23 2026    methocarbamol (ROBAXIN) 500 MG tablet  2 times daily        10/02/23 2026    azithromycin (ZITHROMAX) 250 MG tablet  Daily        10/02/23 2026  Durwin Glaze, MD 10/02/23 2029

## 2023-10-02 NOTE — ED Triage Notes (Signed)
Pt c.o lower back pain that radiates to her left hip, denies numbness or tingling, denies recent injuries/fall

## 2023-10-02 NOTE — Discharge Instructions (Addendum)
Your x-ray showed possible bronchitis.  Please take the steroids and antibiotics.  The steroid should help with your back pain as well.  Please call and schedule follow-up appointment with the neurosurgeon at the number provided.  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  Please take the methocarbamol as needed for pain/spasm.  Do not drive drink alcohol while taking this as may make you drowsy.  Return to the ER for worsening symptoms.

## 2023-10-07 ENCOUNTER — Telehealth (INDEPENDENT_AMBULATORY_CARE_PROVIDER_SITE_OTHER): Payer: Self-pay

## 2023-10-07 NOTE — Telephone Encounter (Signed)
Copied from CRM [id number]. Topic: General - Other >> Oct 05, 2023  2:55 PM Eunice Blase wrote: Reason for CRM: Pt would like to be seen sooner if possible due to EMS called for back on Sunday, Feb. 16, 2025. Please call pt [phone].

## 2023-10-12 DIAGNOSIS — M5416 Radiculopathy, lumbar region: Secondary | ICD-10-CM | POA: Diagnosis not present

## 2023-10-12 DIAGNOSIS — Z6825 Body mass index (BMI) 25.0-25.9, adult: Secondary | ICD-10-CM | POA: Diagnosis not present

## 2023-10-13 ENCOUNTER — Ambulatory Visit (INDEPENDENT_AMBULATORY_CARE_PROVIDER_SITE_OTHER): Payer: Medicaid Other | Admitting: Primary Care

## 2023-10-13 VITALS — BP 114/79 | HR 55

## 2023-10-13 DIAGNOSIS — Z7689 Persons encountering health services in other specified circumstances: Secondary | ICD-10-CM

## 2023-10-13 DIAGNOSIS — Z09 Encounter for follow-up examination after completed treatment for conditions other than malignant neoplasm: Secondary | ICD-10-CM

## 2023-10-13 DIAGNOSIS — J209 Acute bronchitis, unspecified: Secondary | ICD-10-CM

## 2023-10-13 DIAGNOSIS — O24410 Gestational diabetes mellitus in pregnancy, diet controlled: Secondary | ICD-10-CM

## 2023-10-13 DIAGNOSIS — Z1159 Encounter for screening for other viral diseases: Secondary | ICD-10-CM

## 2023-10-13 DIAGNOSIS — E559 Vitamin D deficiency, unspecified: Secondary | ICD-10-CM | POA: Diagnosis not present

## 2023-10-13 DIAGNOSIS — Z1211 Encounter for screening for malignant neoplasm of colon: Secondary | ICD-10-CM

## 2023-10-13 NOTE — Progress Notes (Signed)
 New Patient Office Visit  Subjective    Patient ID: Regina Cross female  DOB: [birth year]  Age: 48 y.o. MRN: [id number]   CC:  If bronchitis has resolved   HPI  Regina Cross is a 49 year old female in today to establish care.  She was recently seen in the emergency room with chief complaints of back pain and hip pain that started a couple days prior.  She was also concerned about a chronic cough that has been off and on for over a month she is able to cough up sputum which was clear.  She also had shortness of breath.  She was diagnosed with bronchitis and treated with antibiotics and prednisone.  Today she still has a cough that is concerning and still has shortness of breath with exertion.  She denies headaches, dizziness,  and chest pain. Chronic hip pain left side throbbing.  Current Outpatient Medications on File Prior to Visit  Medication Sig Dispense Refill   albuterol (VENTOLIN HFA) 108 (90 Base) MCG/ACT inhaler Inhale 2 puffs into the lungs every 6 (six) hours as needed for wheezing or shortness of breath. 8 g 0   acetaminophen (TYLENOL) 325 MG tablet Take 2 tablets (650 mg total) by mouth every 6 (six) hours as needed (for pain scale < 4). (Patient not taking: Reported on 10/13/2023) 30 tablet 3   azithromycin (ZITHROMAX) 250 MG tablet Take 1 tablet (250 mg total) by mouth daily. Take first 2 tablets together, then 1 every day until finished. (Patient not taking: Reported on 10/13/2023) 6 tablet 0   benzocaine-Menthol (DERMOPLAST) 20-0.5 % AERO Apply 1 application topically as needed for irritation (perineal discomfort). (Patient not taking: Reported on 10/13/2023) 56 g 1   doxycycline (VIBRAMYCIN) 100 MG capsule Take 1 capsule (100 mg total) by mouth 2 (two) times daily. (Patient not taking: Reported on 10/13/2023) 14 capsule 0   ibuprofen (ADVIL) 600 MG tablet Take 1 tablet (600 mg total) by mouth every 8 (eight) hours as needed for mild pain. (Patient not taking: Reported on  10/13/2023) 30 tablet 0   methocarbamol (ROBAXIN) 500 MG tablet Take 1 tablet (500 mg total) by mouth 2 (two) times daily. (Patient not taking: Reported on 10/13/2023) 20 tablet 0   norethindrone (MICRONOR) 0.35 MG tablet Take 1 tablet (0.35 mg total) by mouth daily. Start at 4 weeks postpartum (10/14/2019) (Patient not taking: Reported on 10/13/2023) 1 Package 11   predniSONE (DELTASONE) 50 MG tablet Take 1 tablet by mouth daily (Patient not taking: Reported on 10/13/2023) 5 tablet 0   Prenatal Vit-Fe Fumarate-FA (MULTIVITAMIN-PRENATAL) 27-0.8 MG TABS tablet Take 1 tablet by mouth daily at 12 noon. (Patient not taking: Reported on 10/13/2023) 30 tablet 6   No current facility-administered medications on file prior to visit.     Allergies  Allergen Reactions   Corn-Containing Products Rash    Only baby corn    Past Medical History:  Diagnosis Date   Gestational diabetes      Past Surgical History:  Procedure Laterality Date   NO PAST SURGERIES       Family History  Problem Relation Age of Onset   Hypertension Mother     Social History   Socioeconomic History   Marital status: Single    Spouse name: Not on file   Number of children: 3   Years of education: Not on file   Highest education level: Not on file  Occupational History   Not on file  Tobacco Use  Smoking status: Former    Current packs/day: 0.00    Types: Cigarettes    Quit date: 01/30/2019    Years since quitting: 4.7   Smokeless tobacco: Never  Vaping Use   Vaping status: Never Used  Substance and Sexual Activity   Alcohol use: Not Currently   Drug use: Not Currently    Types: Marijuana    Comment: none since end of June   Sexual activity: Yes    Birth control/protection: None  Other Topics Concern   Not on file  Social History Narrative   Not on file   Social Drivers of Health   Financial Resource Strain: Unknown (01/30/2019)   Overall Financial Resource Strain (CARDIA)    Difficulty of Paying  Living Expenses: Patient declined  Food Insecurity: Unknown (01/30/2019)   Hunger Vital Sign    Worried About Running Out of Food in the Last Year: Patient declined    Ran Out of Food in the Last Year: Patient declined  Transportation Needs: Unknown (01/30/2019)   PRAPARE - Administrator, Civil Service (Medical): Patient declined    Lack of Transportation (Non-Medical): Patient declined  Physical Activity: Unknown (01/30/2019)   Exercise Vital Sign    Days of Exercise per Week: Patient declined    Minutes of Exercise per Session: Patient declined  Stress: Unknown (01/30/2019)   [employer] of Occupational Health - Occupational Stress Questionnaire    Feeling of Stress : Patient declined  Social Connections: Unknown (01/30/2019)   Social Connection and Isolation Panel [NHANES]    Frequency of Communication with Friends and Family: Patient declined    Frequency of Social Gatherings with Friends and Family: Patient declined    Attends Religious Services: Patient declined    Active Member of Clubs or Organizations: Patient declined    Attends Banker Meetings: Patient declined    Marital Status: Patient declined  Intimate Partner Violence: Unknown (01/30/2019)   Humiliation, Afraid, Rape, and Kick questionnaire    Fear of Current or Ex-Partner: Patient declined    Emotionally Abused: Patient declined    Physically Abused: Patient declined    Sexually Abused: Patient declined       Health Maintenance  Topic Date Due   Hepatitis C Screening  Never done   Pap with HPV screening  Never done   Colon Cancer Screening  Never done   Flu Shot  Never done   COVID-19 Vaccine (1 - 2024-25 season) Never done   DTaP/Tdap/Td vaccine (2 - Td or Tdap) 06/13/2029   HIV Screening  Completed   HPV Vaccine  Aged Out    Objective    LMP 09/17/2023 (Approximate)     Physical Exam Vitals reviewed.  Constitutional:      Appearance: Normal appearance.  HENT:      Head: Normocephalic.     Right Ear: Tympanic membrane, ear canal and external ear normal.     Left Ear: Tympanic membrane, ear canal and external ear normal.     Nose: Nose normal.     Mouth/Throat:     Mouth: Mucous membranes are moist.  Eyes:     Extraocular Movements: Extraocular movements intact.     Pupils: Pupils are equal, round, and reactive to light.  Cardiovascular:     Rate and Rhythm: Normal rate.  Pulmonary:     Effort: Pulmonary effort is normal.     Breath sounds: Normal breath sounds.  Abdominal:     General: Bowel sounds are normal.  Palpations: Abdomen is soft.  Musculoskeletal:        General: Normal range of motion.     Cervical back: Normal range of motion.  Skin:    General: Skin is warm and dry.  Neurological:     Mental Status: She is alert and oriented to person, place, and time.  Psychiatric:        Mood and Affect: Mood normal.        Behavior: Behavior normal.        Thought Content: Thought content normal.        Assessment & Plan:  Jillisa was seen today for new patient (initial visit).  Diagnoses and all orders for this visit:  Encounter to establish care  Hospital discharge follow-up See HPI  Acute bronchitis, unspecified organism -     CBC with Differential -     CMP14+EGFR  Encounter for HCV screening test for low risk patient -     HCV Ab w Reflex to Quant PCR  Colon cancer screening -     Ambulatory referral to Gastroenterology  Vitamin D deficiency -     Vitamin D, 25-hydroxy  Diet controlled gestational diabetes mellitus (GDM) in second trimester -     CBC with Differential -     Hemoglobin A1c       Follow-up:  3-6 months  The above assessment and management plan was discussed with the patient. The patient verbalized understanding of and has agreed to the management plan. Patient is aware to call the clinic if symptoms fail to improve or worsen. Patient is aware when to return to the clinic for a follow-up  visit. Patient educated on when it is appropriate to go to the emergency department.   Gwinda Passe, NP-C

## 2023-10-13 NOTE — Progress Notes (Signed)
Error

## 2023-10-14 ENCOUNTER — Other Ambulatory Visit: Payer: Self-pay | Admitting: Neurosurgery

## 2023-10-14 DIAGNOSIS — M5416 Radiculopathy, lumbar region: Secondary | ICD-10-CM

## 2023-10-17 ENCOUNTER — Ambulatory Visit (INDEPENDENT_AMBULATORY_CARE_PROVIDER_SITE_OTHER): Admitting: Primary Care

## 2023-10-17 ENCOUNTER — Encounter: Payer: Self-pay | Admitting: Neurosurgery

## 2023-10-17 ENCOUNTER — Ambulatory Visit (INDEPENDENT_AMBULATORY_CARE_PROVIDER_SITE_OTHER): Payer: Medicaid Other | Admitting: Primary Care

## 2023-10-17 DIAGNOSIS — J209 Acute bronchitis, unspecified: Secondary | ICD-10-CM | POA: Diagnosis not present

## 2023-10-17 DIAGNOSIS — O24410 Gestational diabetes mellitus in pregnancy, diet controlled: Secondary | ICD-10-CM

## 2023-10-17 DIAGNOSIS — E559 Vitamin D deficiency, unspecified: Secondary | ICD-10-CM | POA: Diagnosis not present

## 2023-10-17 DIAGNOSIS — Z1159 Encounter for screening for other viral diseases: Secondary | ICD-10-CM | POA: Diagnosis not present

## 2023-10-18 LAB — CBC WITH DIFFERENTIAL/PLATELET
Basophils Absolute: 0 10*3/uL (ref 0.0–0.2)
Basos: 0 %
EOS (ABSOLUTE): 0.1 10*3/uL (ref 0.0–0.4)
Eos: 1 %
Hematocrit: 38.7 % (ref 34.0–46.6)
Hemoglobin: 12.5 g/dL (ref 11.1–15.9)
Immature Grans (Abs): 0 10*3/uL (ref 0.0–0.1)
Immature Granulocytes: 0 %
Lymphocytes Absolute: 2.6 10*3/uL (ref 0.7–3.1)
Lymphs: 39 %
MCH: 28.9 pg (ref 26.6–33.0)
MCHC: 32.3 g/dL (ref 31.5–35.7)
MCV: 90 fL (ref 79–97)
Monocytes Absolute: 0.6 10*3/uL (ref 0.1–0.9)
Monocytes: 8 %
Neutrophils Absolute: 3.4 10*3/uL (ref 1.4–7.0)
Neutrophils: 52 %
Platelets: 262 10*3/uL (ref 150–450)
RBC: 4.32 x10E6/uL (ref 3.77–5.28)
RDW: 12.3 % (ref 11.7–15.4)
WBC: 6.7 10*3/uL (ref 3.4–10.8)

## 2023-10-18 LAB — CMP14+EGFR
ALT: 12 IU/L (ref 0–32)
AST: 14 IU/L (ref 0–40)
Albumin: 4.4 g/dL (ref 3.9–4.9)
Alkaline Phosphatase: 90 IU/L (ref 44–121)
BUN/Creatinine Ratio: 21 (ref 9–23)
BUN: 16 mg/dL (ref 6–24)
Bilirubin Total: 0.3 mg/dL (ref 0.0–1.2)
CO2: 21 mmol/L (ref 20–29)
Calcium: 9.7 mg/dL (ref 8.7–10.2)
Chloride: 101 mmol/L (ref 96–106)
Creatinine, Ser: 0.78 mg/dL (ref 0.57–1.00)
Globulin, Total: 2.9 g/dL (ref 1.5–4.5)
Glucose: 127 mg/dL — ABNORMAL HIGH (ref 70–99)
Potassium: 4.5 mmol/L (ref 3.5–5.2)
Sodium: 138 mmol/L (ref 134–144)
Total Protein: 7.3 g/dL (ref 6.0–8.5)
eGFR: 95 mL/min/{1.73_m2} (ref >59)

## 2023-10-18 LAB — HEMOGLOBIN A1C
Est. average glucose Bld gHb Est-mCnc: 123 mg/dL
Hgb A1c MFr Bld: 5.9 % — ABNORMAL HIGH (ref 4.8–5.6)

## 2023-10-18 LAB — HCV INTERPRETATION

## 2023-10-18 LAB — VITAMIN D 25 HYDROXY (VIT D DEFICIENCY, FRACTURES): Vit D, 25-Hydroxy: 5.5 ng/mL — ABNORMAL LOW (ref 30.0–100.0)

## 2023-10-18 LAB — HCV AB W REFLEX TO QUANT PCR: HCV Ab: NONREACTIVE

## 2023-10-23 ENCOUNTER — Other Ambulatory Visit (INDEPENDENT_AMBULATORY_CARE_PROVIDER_SITE_OTHER): Payer: Self-pay | Admitting: Primary Care

## 2023-10-23 MED ORDER — ERGOCALCIFEROL 1.25 MG (50000 UT) PO CAPS: 50000.0000 [IU] | ORAL_CAPSULE | ORAL | 0 refills | Status: DC

## 2023-10-26 ENCOUNTER — Telehealth (INDEPENDENT_AMBULATORY_CARE_PROVIDER_SITE_OTHER): Payer: Self-pay | Admitting: Primary Care

## 2023-10-26 NOTE — Telephone Encounter (Signed)
 Spoke to pt about appt.. Will be present

## 2023-10-27 ENCOUNTER — Encounter (INDEPENDENT_AMBULATORY_CARE_PROVIDER_SITE_OTHER): Payer: Self-pay | Admitting: Primary Care

## 2023-10-27 ENCOUNTER — Other Ambulatory Visit (HOSPITAL_COMMUNITY)
Admission: RE | Admit: 2023-10-27 | Discharge: 2023-10-27 | Disposition: A | Discharge status: Home / Self Care | Source: Ambulatory Visit | Attending: Primary Care | Admitting: Primary Care

## 2023-10-27 ENCOUNTER — Ambulatory Visit (INDEPENDENT_AMBULATORY_CARE_PROVIDER_SITE_OTHER): Payer: Medicaid Other | Admitting: Primary Care

## 2023-10-27 VITALS — BP 116/81 | HR 87 | Temp 98.0°F | Resp 16 | Ht 62.0 in | Wt 134.0 lb

## 2023-10-27 DIAGNOSIS — Z1211 Encounter for screening for malignant neoplasm of colon: Secondary | ICD-10-CM | POA: Diagnosis not present

## 2023-10-27 DIAGNOSIS — Z1231 Encounter for screening mammogram for malignant neoplasm of breast: Secondary | ICD-10-CM

## 2023-10-27 DIAGNOSIS — Z113 Encounter for screening for infections with a predominantly sexual mode of transmission: Secondary | ICD-10-CM | POA: Diagnosis not present

## 2023-10-27 DIAGNOSIS — E559 Vitamin D deficiency, unspecified: Secondary | ICD-10-CM

## 2023-10-27 DIAGNOSIS — Z124 Encounter for screening for malignant neoplasm of cervix: Secondary | ICD-10-CM

## 2023-10-27 MED ORDER — ERGOCALCIFEROL 1.25 MG (50000 UT) PO CAPS: 50000.0000 [IU] | ORAL_CAPSULE | ORAL | 0 refills | Status: DC

## 2023-10-27 NOTE — Progress Notes (Signed)
  [facility]  WELL-WOMAN PHYSICAL & PAP Patient name: Regina Cross MRN [id number]  Date of birth: [birth year] Chief Complaint:   Gynecologic Exam  History of Present Illness:   Regina Cross is a 48 y.o. [phone] female being seen today for a routine well-woman exam.   JY:NWGN   The current method of family planning is none.  Patient's last menstrual period was 10/13/2023 (approximate).  Family h/o breast cancer: No . Family h/o colorectal cancer: No  Health Maintenance  Topic Date Due   Pap with HPV screening  Never done   Colon Cancer Screening  Never done   Flu Shot  Never done   COVID-19 Vaccine (1 - 2024-25 season) Never done   DTaP/Tdap/Td vaccine (2 - Td or Tdap) 06/13/2029   Hepatitis C Screening  Completed   HIV Screening  Completed   HPV Vaccine  Aged Out   Review of Systems:    Denies any headaches, blurred vision, fatigue, shortness of breath, chest pain, abdominal pain, abnormal vaginal discharge/itching/odor/irritation, problems with periods, bowel movements, urination, or intercourse unless otherwise stated above.  Pertinent History Reviewed:   Reviewed past medical,surgical, social and family history.  Reviewed problem list, medications and allergies.  Physical Assessment:  BP 116/81 (BP Location: Left Arm, Patient Position: Sitting, Cuff Size: Normal)   Pulse 87   Temp 98 F (36.7 C) (Oral)   Resp 16   Ht 5\' 2"  (1.575 m)   Wt 134 lb (60.8 kg)   LMP 10/13/2023 (Approximate)   SpO2 100%   BMI 24.51 kg/m         Physical Examination:  General appearance - well appearing, and in no distress Mental status - alert, oriented to person, place, and time Psych:  She has a normal mood and affect Skin - warm and dry, normal color, no suspicious lesions noted Chest - effort normal, all lung fields clear to auscultation bilaterally Heart - normal rate and regular rhythm Neck:  midline trachea, no thyromegaly or nodules Breasts - breasts  appear normal, nipples are inverted, no suspicious masses, no skin or nipple changes or axillary nodes Educated patient on proper self breast examination and had patient to demonstrate SBE. Abdomen - soft, nontender, nondistended, no masses or organomegaly Pelvic-VULVA: normal appearing vulva with no masses, tenderness or lesions   VAGINA: normal appearing vagina with normal color and discharge, no lesions   CERVIX: normal appearing cervix without discharge or lesions, no CMT UTERUS: uterus is felt to be normal size, shape, consistency and nontender  ADNEXA: No adnexal masses or tenderness noted. Extremities:  No swelling or varicosities noted  No results found for this or any previous visit (from the past 24 hours).   Assessment & Plan:  [name] was seen today for gynecologic exam.  Diagnoses and all orders for this visit:  Cervical cancer screening -     Cytology - PAP  Screening for STD (sexually transmitted disease) -     Cervicovaginal ancillary only  Colon cancer screening -     Ambulatory referral to Gastroenterology  Encounter for screening mammogram for malignant neoplasm of breast MM DIGITAL SCREENING BILATERAL       Meds: No orders of the defined types were placed in this encounter.   Follow-up: Return in about 2 months (around 12/27/2023) for medical conditions.  This note has been created with Education officer, environmental. Any transcriptional errors are unintentional.   Grayce Sessions, NP 10/27/2023, 4:27 PM

## 2023-10-28 ENCOUNTER — Ambulatory Visit
Admission: RE | Admit: 2023-10-28 | Discharge: 2023-10-28 | Disposition: A | Discharge status: Home / Self Care | Source: Ambulatory Visit | Attending: Neurosurgery | Admitting: Neurosurgery

## 2023-10-28 DIAGNOSIS — M5416 Radiculopathy, lumbar region: Secondary | ICD-10-CM | POA: Diagnosis not present

## 2023-10-31 LAB — CERVICOVAGINAL ANCILLARY ONLY
Bacterial Vaginitis (gardnerella): NEGATIVE
Candida Glabrata: NEGATIVE
Candida Vaginitis: POSITIVE — AB
Chlamydia: NEGATIVE
Comment: NEGATIVE
Comment: NEGATIVE
Comment: NEGATIVE
Comment: NEGATIVE
Comment: NEGATIVE
Comment: NORMAL
Neisseria Gonorrhea: NEGATIVE
Trichomonas: NEGATIVE

## 2023-11-02 LAB — CYTOLOGY - PAP
Comment: NEGATIVE
Diagnosis: NEGATIVE
High risk HPV: NEGATIVE

## 2023-11-04 ENCOUNTER — Encounter (INDEPENDENT_AMBULATORY_CARE_PROVIDER_SITE_OTHER): Payer: Self-pay | Admitting: Primary Care

## 2023-11-04 ENCOUNTER — Other Ambulatory Visit (INDEPENDENT_AMBULATORY_CARE_PROVIDER_SITE_OTHER): Payer: Self-pay | Admitting: Primary Care

## 2023-11-04 MED ORDER — NUVESSA 1.3 % VA GEL: 5.0000 g | Freq: Once | VAGINAL | 0 refills | Status: AC

## 2023-11-10 ENCOUNTER — Telehealth (INDEPENDENT_AMBULATORY_CARE_PROVIDER_SITE_OTHER): Payer: Self-pay

## 2023-11-10 MED ORDER — FLUCONAZOLE 150 MG PO TABS: 150.0000 mg | ORAL_TABLET | Freq: Once | ORAL | 0 refills | Status: AC

## 2023-11-10 NOTE — Telephone Encounter (Signed)
 Patient called and informed to contact Dr. Lindalou Hose office for results.     Copied from CRM [id number]. Topic: Clinical - Lab/Test Results >> Nov 10, 2023 11:53 AM Fuller Mandril wrote: Reason for CRM: Patient called to check status of imaging results. Not on MyChart. Advised not yet finalized. Thank You

## 2023-11-10 NOTE — Telephone Encounter (Signed)
 Please advise patient.

## 2023-11-11 ENCOUNTER — Encounter: Payer: Self-pay | Admitting: *Deleted

## 2023-11-11 ENCOUNTER — Ambulatory Visit

## 2023-11-11 ENCOUNTER — Ambulatory Visit: Payer: Self-pay

## 2023-11-11 NOTE — Telephone Encounter (Signed)
 Pt was seen in the office on 3/13. Pt was diagnosed with a yeast infection and prescribed fluconazole. Then, pt was prescribed metronidazole gel d/t a corn allergy. Pt states that when she picked up the metronidazole, she did some research and found that metronidazole is used to treat BV, and not a yeast infection. Pt states this is not the right medication and would like something different prescribed.  Please follow-up with the pt.    Copied from CRM [id number]. Topic: Clinical - Prescription Issue >> Nov 11, 2023  2:45 PM Geneva B wrote: Reason for CRM: patient is calling because she was given metroNIDAZOLE (NUVESSA) 1.3 % GEL by she says this is not what she should be taken because it do not work for yeast infection she has question please call patient Reason for Disposition  Health Information question, no triage required and triager able to answer question  Answer Assessment - Initial Assessment Questions 1. REASON FOR CALL or QUESTION: "What is your reason for calling today?" or "How can I best help you?" or "What question do you have that I can help answer?"     Would like a different medication, states metronidazole will not treat her yeast infection  Protocols used: Information Only Call - No Triage-A-AH

## 2023-11-12 NOTE — Telephone Encounter (Signed)
 Please route to PCP/appropriate provider who saw pt.

## 2023-11-14 ENCOUNTER — Encounter (INDEPENDENT_AMBULATORY_CARE_PROVIDER_SITE_OTHER): Payer: Self-pay | Admitting: Primary Care

## 2023-11-16 ENCOUNTER — Ambulatory Visit
Admission: RE | Admit: 2023-11-16 | Discharge: 2023-11-16 | Disposition: A | Discharge status: Home / Self Care | Source: Ambulatory Visit | Attending: Primary Care | Admitting: Primary Care

## 2023-11-16 DIAGNOSIS — Z1231 Encounter for screening mammogram for malignant neoplasm of breast: Secondary | ICD-10-CM

## 2023-11-18 ENCOUNTER — Other Ambulatory Visit (INDEPENDENT_AMBULATORY_CARE_PROVIDER_SITE_OTHER): Payer: Self-pay | Admitting: Primary Care

## 2023-11-18 DIAGNOSIS — N6459 Other signs and symptoms in breast: Secondary | ICD-10-CM

## 2023-11-24 DIAGNOSIS — M5416 Radiculopathy, lumbar region: Secondary | ICD-10-CM | POA: Diagnosis not present

## 2023-11-28 ENCOUNTER — Ambulatory Visit
Admission: RE | Admit: 2023-11-28 | Discharge: 2023-11-28 | Disposition: A | Discharge status: Home / Self Care | Source: Ambulatory Visit | Attending: Primary Care | Admitting: Primary Care

## 2023-11-28 ENCOUNTER — Other Ambulatory Visit (INDEPENDENT_AMBULATORY_CARE_PROVIDER_SITE_OTHER): Payer: Self-pay | Admitting: Primary Care

## 2023-11-28 DIAGNOSIS — N6459 Other signs and symptoms in breast: Secondary | ICD-10-CM

## 2023-11-28 DIAGNOSIS — N6453 Retraction of nipple: Secondary | ICD-10-CM | POA: Diagnosis not present

## 2023-11-30 ENCOUNTER — Telehealth (INDEPENDENT_AMBULATORY_CARE_PROVIDER_SITE_OTHER): Payer: Self-pay | Admitting: Primary Care

## 2023-11-30 NOTE — Telephone Encounter (Signed)
 Called pt to confirm appt. Pt will be present.

## 2023-12-06 ENCOUNTER — Encounter (INDEPENDENT_AMBULATORY_CARE_PROVIDER_SITE_OTHER): Payer: Self-pay | Admitting: Primary Care

## 2023-12-06 ENCOUNTER — Ambulatory Visit (INDEPENDENT_AMBULATORY_CARE_PROVIDER_SITE_OTHER): Admitting: Primary Care

## 2023-12-06 VITALS — BP 120/81 | HR 71 | Resp 16 | Wt 136.0 lb

## 2023-12-06 DIAGNOSIS — M79671 Pain in right foot: Secondary | ICD-10-CM | POA: Diagnosis not present

## 2023-12-06 DIAGNOSIS — M79672 Pain in left foot: Secondary | ICD-10-CM

## 2023-12-06 DIAGNOSIS — M25552 Pain in left hip: Secondary | ICD-10-CM

## 2023-12-06 DIAGNOSIS — M25542 Pain in joints of left hand: Secondary | ICD-10-CM | POA: Diagnosis not present

## 2023-12-06 DIAGNOSIS — M25541 Pain in joints of right hand: Secondary | ICD-10-CM | POA: Diagnosis not present

## 2023-12-06 NOTE — Progress Notes (Signed)
 [facility]  Regina Cross, is a 48 y.o. female  NWG:[id number]  VHQ:[id number]  DOB - November 12, 1975  Chief Complaint  Patient presents with   Hip Pain    Left  Pain is everyday and all day Pain is painful to where she starts crying Painful to walk        Subjective:   Regina Cross is a 48 y.o. female here today for an acute visit.  Hip Pain  The pain is present in the left hip. The quality of the pain is described as shooting and cramping. The pain is at a severity of 7/10. The pain is moderate. The pain has been Worsening since onset. Associated symptoms include muscle weakness and tingling. The symptoms are aggravated by movement. She has tried ice and heat for the symptoms. The treatment provided no relief.    No problems updated.  Comprehensive ROS Pertinent positive and negative noted in HPI   Allergies  Allergen Reactions   Corn-Containing Products Rash    Only baby corn    Past Medical History:  Diagnosis Date   Gestational diabetes     Current Outpatient Medications on File Prior to Visit  Medication Sig Dispense Refill   acetaminophen  (TYLENOL ) 325 MG tablet Take 2 tablets (650 mg total) by mouth every 6 (six) hours as needed (for pain scale < 4). (Patient not taking: Reported on 10/13/2023) 30 tablet 3   ergocalciferol  (VITAMIN D2) 1.25 MG (50000 UT) capsule Take 1 capsule (50,000 Units total) by mouth once a week. 12 capsule 0   No current facility-administered medications on file prior to visit.   Health Maintenance  Topic Date Due   Colon Cancer Screening  Never done   COVID-19 Vaccine (1 - 2024-25 season) Never done   Flu Shot  03/16/2024   Pap with HPV screening  10/26/2028   DTaP/Tdap/Td vaccine (2 - Td or Tdap) 06/13/2029   Hepatitis C Screening  Completed   HIV Screening  Completed   HPV Vaccine  Aged Out   Meningitis B Vaccine  Aged Out    Objective:   Vitals:   12/06/23 1523  BP: 120/81  Pulse: 71  Resp: 16  SpO2:  100%  Weight: 136 lb (61.7 kg)     Physical Exam Vitals reviewed.  Constitutional:      Appearance: She is normal weight.  HENT:     Head: Normocephalic.     Right Ear: External ear normal.     Left Ear: External ear normal.     Nose: Nose normal.  Eyes:     Extraocular Movements: Extraocular movements intact.  Cardiovascular:     Rate and Rhythm: Normal rate and regular rhythm.  Pulmonary:     Effort: Pulmonary effort is normal.     Breath sounds: Normal breath sounds.  Abdominal:     General: Bowel sounds are normal.     Palpations: Abdomen is soft.  Musculoskeletal:     Cervical back: Normal range of motion.     Comments: Left hip pain without sciatica  Finger lock and has to pull open to straighten   Skin:    Comments: Discoloration and texture bilateral hands  Neurological:     Mental Status: She is alert and oriented to person, place, and time.  Psychiatric:        Mood and Affect: Mood normal.        Behavior: Behavior normal.     Assessment & Plan  Regina "Jeralyn Mon" was seen  today for hip pain.  Diagnoses and all orders for this visit:  Pain involving joints of fingers of both hands Ambulatory referral to Dermatology  Ambulatory referral to Orthopedic Surgery  Pain of left hip -     Ambulatory referral to Orthopedic Surgery  Pain in both feet dorsum pedis also swells after walking for prolong periods -     Ambulatory referral to Podiatry  Patient have been counseled extensively about nutrition and exercise. Other issues discussed during this visit include: low cholesterol diet, weight control and daily exercise, foot care, annual eye examinations at Ophthalmology, importance of adherence with medications and regular follow-up. We also discussed long term complications of uncontrolled diabetes and hypertension.   Return in about 6 months (around 06/06/2024).  The patient was given clear instructions to go to ER or return to medical center if symptoms  don't improve, worsen or new problems develop. The patient verbalized understanding. The patient was told to call to get lab results if they haven't heard anything in the next week.   This note has been created with Education officer, environmental. Any transcriptional errors are unintentional.   Marius Siemens, NP 12/06/2023, 4:00 PM

## 2023-12-07 ENCOUNTER — Encounter: Payer: Self-pay | Admitting: Pediatrics

## 2023-12-08 ENCOUNTER — Ambulatory Visit (INDEPENDENT_AMBULATORY_CARE_PROVIDER_SITE_OTHER): Admitting: Primary Care

## 2023-12-12 NOTE — Therapy (Incomplete)
 OUTPATIENT PHYSICAL THERAPY THORACOLUMBAR EVALUATION   Patient Name: Regina Cross MRN: [id number] DOB:1975/11/12, 48 y.o., female Today's Date: 12/12/2023  END OF SESSION:   Past Medical History:  Diagnosis Date   Gestational diabetes    Past Surgical History:  Procedure Laterality Date   NO PAST SURGERIES     Patient Active Problem List   Diagnosis Date Noted   Encounter for induction of labor 09/14/2019   Transient hypertension of pregnancy in third trimester 08/01/2019   Gestational diabetes mellitus (GDM) affecting pregnancy 06/19/2019   Patient noncompliance 05/02/2019   AMA (advanced maternal age) multigravida 35+ 04/18/2019   Abnormal fetal ultrasound 02/23/2019   Supervision of high risk pregnancy, antepartum 01/30/2019    PCP: Marius Siemens, NP   REFERRING PROVIDER: Agustina Aldrich, MD   REFERRING DIAG: M54.16 (ICD-10-CM) - Radiculopathy, lumbar region   Rationale for Evaluation and Treatment: Rehabilitation  THERAPY DIAG:  No diagnosis found.  ONSET DATE: ***  SUBJECTIVE:                                                                                                                                                                                           SUBJECTIVE STATEMENT: ***  PERTINENT HISTORY:  ***  PAIN:  Are you having pain? Yes: NPRS scale: *** Pain location: *** Pain description: *** Aggravating factors: *** Relieving factors: ***  PRECAUTIONS: {Therapy precautions:24002}  RED FLAGS: {PT Red Flags:29287}   WEIGHT BEARING RESTRICTIONS: {Yes ***/No:24003}  FALLS:  Has patient fallen in last 6 months? {fallsyesno:27318}  LIVING ENVIRONMENT: Lives with: {[facility] lives with:25569::"lives with their family"} Lives in: {Lives in:25570} Stairs: {opstairs:27293} Has following equipment at home: {Assistive devices:23999}  OCCUPATION: ***  PLOF: {PLOF:24004}  PATIENT GOALS: ***  NEXT MD VISIT: ***  OBJECTIVE:  Note:  Objective measures were completed at Evaluation unless otherwise noted.  DIAGNOSTIC FINDINGS:  MRI Lumbar Spine- 10/28/23 Disc levels:   L1-L2: Disc is normal in configuration. No facet arthropathy. No neuroforaminal stenosis. No spinal canal stenosis.   L2-L3: Disc is normal in configuration. No facet arthropathy. No neuroforaminal stenosis. No spinal canal stenosis.   L3-L4: Disc is normal in configuration. Mild facet arthropathy. No neuroforaminal stenosis. No spinal canal stenosis.   L4-L5: Disc is normal in configuration. Mild bilateral facet arthropathy. No neuroforaminal stenosis. No spinal canal stenosis.   L5-S1: Disc is normal in configuration. No facet arthropathy. No neuroforaminal stenosis. No spinal canal stenosis.   IMPRESSION: Mild facet arthropathy of the levels. No disc herniation. No canal or foraminal stenosis.  PATIENT SURVEYS:  {rehab surveys:24030}  COGNITION: Overall cognitive status: {cognition:24006}     SENSATION: {sensation:27233}  MUSCLE LENGTH: Hamstrings: Right *** deg; Left *** deg Andy Bannister test: Right *** deg; Left *** deg  POSTURE: {posture:25561}  PALPATION: ***  LUMBAR ROM:   AROM eval  Flexion   Extension   Right lateral flexion   Left lateral flexion   Right rotation   Left rotation    (Blank rows = not tested)  LOWER EXTREMITY ROM:     {AROM/PROM:27142}  Right eval Left eval  Hip flexion    Hip extension    Hip abduction    Hip adduction    Hip internal rotation    Hip external rotation    Knee flexion    Knee extension    Ankle dorsiflexion    Ankle plantarflexion    Ankle inversion    Ankle eversion     (Blank rows = not tested)  LOWER EXTREMITY MMT:    MMT Right eval Left eval  Hip flexion    Hip extension    Hip abduction    Hip adduction    Hip internal rotation    Hip external rotation    Knee flexion    Knee extension    Ankle dorsiflexion    Ankle plantarflexion    Ankle inversion     Ankle eversion     (Blank rows = not tested)  LUMBAR SPECIAL TESTS:  {lumbar special test:25242}  FUNCTIONAL TESTS:  {Functional tests:24029}  GAIT: Distance walked: *** Assistive device utilized: {Assistive devices:23999} Level of assistance: {Levels of assistance:24026} Comments: ***  TREATMENT DATE: ***                                                                                                                                 PATIENT EDUCATION:  Education details: *** Person educated: {Person educated:25204} Education method: {Education Method:25205} Education comprehension: {Education Comprehension:25206}  HOME EXERCISE PROGRAM: ***  ASSESSMENT:  CLINICAL IMPRESSION: Patient is a 48 y.o. female who was seen today for physical therapy evaluation and treatment for M54.16 (ICD-10-CM) - Radiculopathy, lumbar region .   OBJECTIVE IMPAIRMENTS: {opptimpairments:25111}.   ACTIVITY LIMITATIONS: {activitylimitations:27494}  PARTICIPATION LIMITATIONS: {participationrestrictions:25113}  PERSONAL FACTORS: {Personal factors:25162} are also affecting patient's functional outcome.   REHAB POTENTIAL: {rehabpotential:25112}  CLINICAL DECISION MAKING: {clinical decision making:25114}  EVALUATION COMPLEXITY: {Evaluation complexity:25115}   GOALS:   SHORT TERM GOALS: Target date: ***  *** Baseline: Goal status: INITIAL  2.  *** Baseline:  Goal status: INITIAL  3.  *** Baseline:  Goal status: INITIAL  4.  *** Baseline:  Goal status: INITIAL  5.  *** Baseline:  Goal status: INITIAL  6.  *** Baseline:  Goal status: INITIAL  LONG TERM GOALS: Target date: ***  *** Baseline:  Goal status: INITIAL  2.  *** Baseline:  Goal status: INITIAL  3.  *** Baseline:  Goal status: INITIAL  4.  *** Baseline:  Goal status: INITIAL  5.  *** Baseline:  Goal status: INITIAL  6.  ***  Baseline:  Goal status: INITIAL  PLAN:  PT FREQUENCY: {rehab  frequency:25116}  PT DURATION: {rehab duration:25117}  PLANNED INTERVENTIONS: {rehab planned interventions:25118::"97110-Therapeutic exercises","97530- Therapeutic [phone]- Neuromuscular re-education","97535- Self VHQI","69629- Manual therapy"}.  PLAN FOR NEXT SESSION: ***   Liborio Reeds, PT 12/12/2023, 11:03 PM

## 2023-12-13 ENCOUNTER — Ambulatory Visit: Attending: Neurosurgery

## 2023-12-13 DIAGNOSIS — M25552 Pain in left hip: Secondary | ICD-10-CM | POA: Diagnosis not present

## 2023-12-13 DIAGNOSIS — M5459 Other low back pain: Secondary | ICD-10-CM | POA: Insufficient documentation

## 2023-12-13 DIAGNOSIS — R293 Abnormal posture: Secondary | ICD-10-CM | POA: Insufficient documentation

## 2023-12-13 NOTE — Therapy (Signed)
 OUTPATIENT PHYSICAL THERAPY THORACOLUMBAR EVALUATION   Patient Name: Regina Cross MRN: [id number] DOB:June 14, 1976, 48 y.o., female Today's Date: 12/14/2023  END OF SESSION:  PT End of Session - 12/14/23 0547     Visit Number 1    Number of Visits 13    Date for PT Re-Evaluation 02/03/24    Authorization Type [place] MEDICAID HEALTH[employer] BLUE    PT Start Time 0430    PT Stop Time 0515    PT Time Calculation (min) 45 min    Activity Tolerance Patient tolerated treatment well    Behavior During Therapy [facility] for tasks assessed/performed             Past Medical History:  Diagnosis Date   Gestational diabetes    Past Surgical History:  Procedure Laterality Date   NO PAST SURGERIES     Patient Active Problem List   Diagnosis Date Noted   Encounter for induction of labor 09/14/2019   Transient hypertension of pregnancy in third trimester 08/01/2019   Gestational diabetes mellitus (GDM) affecting pregnancy 06/19/2019   Patient noncompliance 05/02/2019   AMA (advanced maternal age) multigravida 35+ 04/18/2019   Abnormal fetal ultrasound 02/23/2019   Supervision of high risk pregnancy, antepartum 01/30/2019    PCP: Marius Siemens, NP   REFERRING PROVIDER: Agustina Aldrich, MD   REFERRING DIAG: M54.16 (ICD-10-CM) - Radiculopathy, lumbar region   Rationale for Evaluation and Treatment: Rehabilitation  THERAPY DIAG:  Other low back pain  Pain in left hip  Abnormal posture  ONSET DATE: Mid-February  SUBJECTIVE:                                                                                                                                                                                           SUBJECTIVE STATEMENT: Pt reports a Hx of intermittent chronic low back pain, but in the middle of February it developed into significant pain. Since then the pain has improved, but she has daily back and L hip pain. She feels that her low back and hip pain are 2 separate issues, Pt  is afraid the internse pain will return.  PERTINENT HISTORY:  See PMH  PAIN:  Are you having pain? Yes: NPRS scale: 6/10 Pain location: midline low back Pain description: ache, sharp Aggravating factors: Not particularly Relieving factors: Unsure  Are you having pain? Yes: NPRS scale: 3/10 Pain location: L lateral hip Pain description: throb, pops Aggravating factors: No  Relieving factors: None  PRECAUTIONS: None  RED FLAGS: None   WEIGHT BEARING RESTRICTIONS: No  FALLS:  Has patient fallen in last 6 months? No  LIVING ENVIRONMENT: Lives with: lives  with their family Lives in: House/apartment Ableto access home  OCCUPATION: Not working  PLOF: Independent  PATIENT GOALS: Less pain and to move better  NEXT MD VISIT: Not currently  OBJECTIVE:  Note: Objective measures were completed at Evaluation unless otherwise noted.  DIAGNOSTIC FINDINGS:  MRI Lumbar Spine- 10/28/23   IMPRESSION: Mild facet arthropathy of the levels. No disc herniation. No canal or foraminal stenosis.  PATIENT SURVEYS:  Modified Oswestry 29/50=58%   COGNITION: Overall cognitive status: Within functional limits for tasks assessed     SENSATION: WFL  MUSCLE LENGTH: Hamstrings: Right WNLS deg; Left WNLS deg Andy Bannister test: Right WNLS deg; Left WNLS deg  POSTURE: rounded shoulders, forward head, and increased lumbar lordosis  PALPATION: TTP to the lower thoraci and lumbar region. TTP to the distal greater trochanter area  LUMBAR ROM:   AROM eval  Flexion 75%; tight LB and hams  Extension 50%, no P  Right lateral flexion 75%, P  Left lateral flexion 75%, P  Right rotation 75%, P and tight  Left rotation 75%, P and tight   (Blank rows = not tested)  LOWER EXTREMITY ROM:     WFLs Active  Right eval Left eval  Hip flexion    Hip extension    Hip abduction    Hip adduction    Hip internal rotation    Hip external rotation    Knee flexion    Knee extension    Ankle  dorsiflexion    Ankle plantarflexion    Ankle inversion    Ankle eversion     (Blank rows = not tested)  LOWER EXTREMITY MMT:    Myotome screen was negative. Weak core. MMT Right eval Left eval  Hip flexion 4 4  Hip extension 3+ 3+  Hip abduction 4 4  Hip adduction    Hip internal rotation    Hip external rotation 4 4  Knee flexion    Knee extension    Ankle dorsiflexion    Ankle plantarflexion    Ankle inversion    Ankle eversion     (Blank rows = not tested)  LUMBAR SPECIAL TESTS:  Straight leg raise test: Negative, Slump test: Negative, and SI Compression/distraction test: Negative  FUNCTIONAL TESTS:  5 times sit to stand: TBA  GAIT: Distance walked: 200' Assistive device utilized: None Level of assistance: Complete Independence Comments: WNLs  TREATMENT DATE:  [facility] Adult PT Treatment:                                                DATE: 12/13/23 Therapeutic Exercise: Developed, instructed in, and pt completed therex as noted in HEP   PATIENT EDUCATION:  Education details: Eval findings, POC, HEP Person educated: Patient Education method: Explanation, Demonstration, Tactile cues, and Verbal cues Education comprehension: verbalized understanding, returned demonstration, verbal cues required, and tactile cues required  HOME EXERCISE PROGRAM: Access Code: [email or web address] URL: [email or web address] Date: 12/13/2023 Prepared by: Liborio Reeds  Exercises - Hooklying Single Knee to Chest  - 1 x daily - 7 x weekly - 1 sets - 3 reps - 20 hold - Supine Double Knee to Chest  - 1 x daily - 7 x weekly - 1 sets - 3 reps - 20 hold - Supine Lower Trunk Rotation  - 1 x daily - 7 x weekly - 1 sets - 10 reps - 3  hold - Supine Posterior Pelvic Tilt  - 1 x daily - 7 x weekly - 1 sets - 10 reps - 3 hold - Supine Piriformis Stretch with Foot on Ground  - 1 x daily - 7 x weekly - 1 sets - 3 reps - 20 hold - Supine Figure 4 Piriformis Stretch  - 1 x daily - 7 x weekly - 1  sets - 3 reps - 20 hold - Hooklying Clamshell with Resistance  - 1 x daily - 7 x weekly - 1 sets - 10 reps - 5 hold  ASSESSMENT:  CLINICAL IMPRESSION: Patient is a 48 y.o. female who was seen today for physical therapy evaluation and treatment for M54.16 (ICD-10-CM) - Radiculopathy, lumbar region. Pt presents with low back pain which appearing related to increased lordosis and weak hips and core. L hip pain appears related to tendinopathy/bursitis. A HEP was initiated. Pt will benefit from skilled PT 2w6 to address impairments to optimize back and hip function with less pain.   OBJECTIVE IMPAIRMENTS: decreased activity tolerance, decreased strength, postural dysfunction, and pain.   ACTIVITY LIMITATIONS: carrying, lifting, bending, squatting, and sleeping  PARTICIPATION LIMITATIONS: meal prep, cleaning, laundry, and community activity  PERSONAL FACTORS: Past/current experiences and Time since onset of injury/illness/exacerbation are also affecting patient's functional outcome.   REHAB POTENTIAL: Good  CLINICAL DECISION MAKING: Evolving/moderate complexity  EVALUATION COMPLEXITY: Moderate   GOALS:   SHORT TERM GOALS =LTGS   LONG TERM GOALS: Target date: 02/03/24  Pt will be Ind in a final HEP to maintain achieved LOF  Baseline: started Goal status: INITIAL  2.  Increase bilat hip strength to 4+/5 for improved lumbopelvic strength to help suppport the pt's low back Baseline: see flow sheets Goal status: INITIAL  3.  Pt will report 50% improvement in low back and L hip pain with ADLs for improved function and QOL Baseline:  Goal status: INITIAL  4.  Pt will demonstrate proper body mechanics for household activities Baseline:  Goal status: INITIAL  5.  Improve 5xSTS by MCID of 5" as indication of improved functional mobility  Baseline: TBA Goal status: INITIAL  6.  Pt's MODI score will improved to the predicted value of 46% or less as indication of improved function   Baseline: 58% Goal status: INITIAL  PLAN:  PT FREQUENCY: 2x/week  PT DURATION: 6 weeks  PLANNED INTERVENTIONS: 97164- PT Re-evaluation, 97110-Therapeutic exercises, 97530- Therapeutic activity, 97112- Neuromuscular re-education, 97535- Self Care, 65784- Manual therapy, [id number]- Aquatic Therapy, [id number]- Electrical stimulation (unattended), Patient/Family education, Taping, Dry Needling, Joint mobilization, Spinal mobilization, Cryotherapy, and Moist heat.  PLAN FOR NEXT SESSION: Assess 5xSTS; assess response to HEP; progress therex as indicated; use of modalities, manual therapy; and TPDN as indicated.   Enrique Weiss MS, PT 12/14/23 12:55 PM   For all possible CPT codes, reference the Planned Interventions line above.     Check all conditions that are expected to impact treatment: {Conditions expected to impact treatment:Musculoskeletal disorders   If treatment provided at initial evaluation, no treatment charged due to lack of authorization.

## 2023-12-14 ENCOUNTER — Ambulatory Visit: Admitting: Podiatry

## 2023-12-14 ENCOUNTER — Other Ambulatory Visit: Payer: Self-pay

## 2023-12-20 ENCOUNTER — Ambulatory Visit (AMBULATORY_SURGERY_CENTER)

## 2023-12-20 VITALS — Ht 62.0 in | Wt 137.0 lb

## 2023-12-20 DIAGNOSIS — Z1211 Encounter for screening for malignant neoplasm of colon: Secondary | ICD-10-CM

## 2023-12-20 MED ORDER — PEG 3350-KCL-NA BICARB-NACL 420 G PO SOLR
4000.0000 mL | Freq: Once | ORAL | 0 refills | Status: AC
Start: 2023-12-20 — End: 2023-12-20

## 2023-12-20 NOTE — Progress Notes (Signed)

## 2023-12-21 ENCOUNTER — Ambulatory Visit (INDEPENDENT_AMBULATORY_CARE_PROVIDER_SITE_OTHER): Admitting: Podiatry

## 2023-12-21 ENCOUNTER — Other Ambulatory Visit: Payer: Self-pay | Admitting: Podiatry

## 2023-12-21 ENCOUNTER — Ambulatory Visit (INDEPENDENT_AMBULATORY_CARE_PROVIDER_SITE_OTHER)

## 2023-12-21 DIAGNOSIS — M2011 Hallux valgus (acquired), right foot: Secondary | ICD-10-CM | POA: Diagnosis not present

## 2023-12-21 DIAGNOSIS — Z01818 Encounter for other preprocedural examination: Secondary | ICD-10-CM

## 2023-12-21 DIAGNOSIS — M79671 Pain in right foot: Secondary | ICD-10-CM

## 2023-12-21 NOTE — Progress Notes (Signed)
 Subjective:  Patient ID: Regina Cross, female    DOB: Jan 02, 1976,  MRN: [id number]  Chief Complaint  Patient presents with   Foot Pain    Bilateral foot pain  Pt stated that her feet her tight and she thinks she has bunions     48 y.o. female presents with the above complaint.  Patient presents with right moderate bunion deformity has been present for quite some time she has tried shoe gear modification padding offloading all conservative care she would like to discuss surgical options at this time her pain scale 7 out of 10 dull aching nature hurts worse when she is ambulating and walking.  Review of Systems: Negative except as noted in the HPI. Denies N/V/F/Ch.  Past Medical History:  Diagnosis Date   Gestational diabetes     Current Outpatient Medications:    polyethylene glycol-electrolytes (NULYTELY) 420 g solution, Take by mouth., Disp: , Rfl:    acetaminophen  (TYLENOL ) 325 MG tablet, Take 2 tablets (650 mg total) by mouth every 6 (six) hours as needed (for pain scale < 4). (Patient not taking: Reported on 10/13/2023), Disp: 30 tablet, Rfl: 3   ergocalciferol  (VITAMIN D2) 1.25 MG (50000 UT) capsule, Take 1 capsule (50,000 Units total) by mouth once a week., Disp: 12 capsule, Rfl: 0  Social History   Tobacco Use  Smoking Status Every Day   Current packs/day: 0.00   Types: Cigarettes   Last attempt to quit: 01/30/2019   Years since quitting: 4.8  Smokeless Tobacco Never    Allergies  Allergen Reactions   Corn-Containing Products Rash    Only baby corn   Objective:  There were no vitals filed for this visit. There is no height or weight on file to calculate BMI. Constitutional Well developed. Well nourished.  Vascular Dorsalis pedis pulses palpable bilaterally. Posterior tibial pulses palpable bilaterally. Capillary refill normal to all digits.  No cyanosis or clubbing noted. Pedal hair growth normal.  Neurologic Normal speech. Oriented to person, place, and  time. Epicritic sensation to light touch grossly present bilaterally.  Dermatologic Nails well groomed and normal in appearance. No open wounds. No skin lesions.  Orthopedic: Normal joint ROM without pain or crepitus bilaterally. Hallux abductovalgus deformity present Left 1st MPJ diminished range of motion. Left 1st TMT without gross hypermobility. Right 1st MPJ diminished range of motion  Right 1st TMT without gross hypermobility. Lesser digital contractures absent bilaterally.   Radiographs: Taken and reviewed. Hallux abductovalgus deformity present. Metatarsal parabola normal. 1st/2nd IMA: Moderate; TSP: 5 out of 7  Assessment:   1. Hav (hallux abducto valgus), right   2. Encounter for preoperative examination for general surgical procedure    Plan:  Patient was evaluated and treated and all questions answered.  Hallux abductovalgus deformity, Right -XR as above. -Patient has failed all conservative therapy and wishes to proceed with surgical intervention. All risks, benefits, and alternatives discussed with patient. No guarantees given. Consent reviewed and signed by patient. Post-op course explained at length. -Planned procedures: Chevron osteotomy with phalangeal osteotomy -Risk factors: None -I discussed my preoperative intra postop plan with the patient in extensive detail she states understanding elected proceed with the listed above surgery. -Informed surgical risk consent was reviewed and read aloud to the patient.  I reviewed the films.  I have discussed my findings with the patient in great detail.  I have discussed all risks including but not limited to infection, stiffness, scarring, limp, disability, deformity, damage to blood vessels and nerves, numbness, poor  healing, need for braces, arthritis, chronic pain, amputation, death.  All benefits and realistic expectations discussed in great detail.  I have made no promises as to the outcome.  I have provided realistic  expectations.  I have offered the patient a 2nd opinion, which they have declined and assured me they preferred to proceed despite the risks   No follow-ups on file.

## 2023-12-22 ENCOUNTER — Telehealth: Payer: Self-pay | Admitting: Podiatry

## 2023-12-22 NOTE — Telephone Encounter (Signed)
 Received surgical consent forms, left message for pt to call me back to schedule.

## 2023-12-23 ENCOUNTER — Ambulatory Visit: Admitting: Physician Assistant

## 2023-12-23 ENCOUNTER — Other Ambulatory Visit: Payer: Self-pay

## 2023-12-23 ENCOUNTER — Encounter: Payer: Self-pay | Admitting: Physician Assistant

## 2023-12-23 DIAGNOSIS — M79641 Pain in right hand: Secondary | ICD-10-CM | POA: Diagnosis not present

## 2023-12-23 DIAGNOSIS — M79642 Pain in left hand: Secondary | ICD-10-CM

## 2023-12-23 DIAGNOSIS — M7062 Trochanteric bursitis, left hip: Secondary | ICD-10-CM

## 2023-12-23 MED ORDER — LIDOCAINE HCL 1 % IJ SOLN
3.0000 mL | INTRAMUSCULAR | Status: AC | PRN
  Administered 2023-12-23: 3 mL

## 2023-12-23 MED ORDER — BUPIVACAINE HCL 0.25 % IJ SOLN
2.0000 mL | INTRAMUSCULAR | Status: AC | PRN
  Administered 2023-12-23: 2 mL via INTRA_ARTICULAR

## 2023-12-23 MED ORDER — METHYLPREDNISOLONE ACETATE 40 MG/ML IJ SUSP
40.0000 mg | INTRAMUSCULAR | Status: AC | PRN
  Administered 2023-12-23: 40 mg via INTRA_ARTICULAR

## 2023-12-23 MED ORDER — DICLOFENAC SODIUM 75 MG PO TBEC: 75.0000 mg | DELAYED_RELEASE_TABLET | Freq: Two times a day (BID) | ORAL | 2 refills | Status: DC | PRN

## 2023-12-23 NOTE — Progress Notes (Signed)
 Office Visit Note   Patient: Regina Cross           Date of Birth: 09/22/1975           MRN: [id number] Visit Date: 12/23/2023              Requested by: Marius Siemens, NP [address] [place],  [place] [address] PCP: Marius Siemens, NP   Assessment & Plan: Visit Diagnoses:  1. Trochanteric bursitis, left hip   2. Bilateral hand pain     Plan: Impression is left hip trochanteric bursitis with possible component of piriformis syndrome and bilateral hand pain inflammatory in nature.  In regards to her hip, we have discussed trochanteric bursa cortisone injection today followed by course of physical therapy.  Referrals been made.  In regards to her hand, we have discussed oral and topical anti-inflammatories as needed.  Follow-up as needed.  Follow-Up Instructions: Return if symptoms worsen or fail to improve.   Orders:  Orders Placed This Encounter  Procedures   Large Joint Inj: L greater trochanter   XR Pelvis 1-2 Views   XR Hand Complete Left   XR Hand Complete Right   Ambulatory referral to Physical Therapy   Meds ordered this encounter  Medications   diclofenac (VOLTAREN) 75 MG EC tablet    Sig: Take 1 tablet (75 mg total) by mouth 2 (two) times daily as needed.    Dispense:  60 tablet    Refill:  2      Procedures: Large Joint Inj: L greater trochanter on 12/23/2023 4:27 PM Indications: pain Details: 22 G needle, lateral approach Medications: 3 mL lidocaine  1 %; 2 mL bupivacaine  0.25 %; 40 mg methylPREDNISolone acetate 40 MG/ML      Clinical Data: No additional findings.   Subjective: Chief Complaint  Patient presents with   Left Hip - Pain    HPI patient is a pleasant 48 year old right-hand-dominant female who comes in today with left lateral hip pain as well as bilateral hand pain both equally as bad.  Regards to her left hip, all of her pain is to the lateral aspect with occasional radiation posteriorly.  Pain is constant but  worse when she is lying on her left side.  She has tried stretching and over-the-counter medication without relief.  She denies any paresthesias or weakness to the left lower extremity.  She underwent lumbar spine MRI about a month ago that showed mild facet arthropathy L3-4 and L4-5.  In regards to her hands, she notes a constant pressure and achiness to the DIP and PIP joints of all of her fingers except for her thumbs.  Symptoms are worse when she is typing and doing her daughter's hair.  She denies any personal or family history of autoimmune disease.  Review of Systems as detailed in HPI.  All others reviewed and are negative.   Objective: Vital Signs: LMP 12/09/2023   Physical Exam well-developed well-nourished female in no acute distress.  Alert and oriented x 3.  Ortho Exam left hip exam: Moderate tenderness to the greater trochanter.  Mild tenderness along the piriformis.  No pain with logroll or FADIR testing.  Negative Stinchfield.  Negative straight leg raise.  No focal weakness.  She is neurovascularly intact distally.  Bilateral hand exam: No swelling.  No tenderness to the PIP or DIP joints.  Painless range of motion of all of her joints.  She is neurovascularly intact distally.  Specialty Comments:  No  specialty comments available.  Imaging: No results found.   PMFS History: Patient Active Problem List   Diagnosis Date Noted   Encounter for induction of labor 09/14/2019   Transient hypertension of pregnancy in third trimester 08/01/2019   Gestational diabetes mellitus (GDM) affecting pregnancy 06/19/2019   Patient noncompliance 05/02/2019   AMA (advanced maternal age) multigravida 35+ 04/18/2019   Abnormal fetal ultrasound 02/23/2019   Supervision of high risk pregnancy, antepartum 01/30/2019   Past Medical History:  Diagnosis Date   Gestational diabetes     Family History  Problem Relation Age of Onset   Hypertension Mother    Stomach cancer Maternal Aunt     Colon cancer Neg Hx    Rectal cancer Neg Hx    Esophageal cancer Neg Hx     Past Surgical History:  Procedure Laterality Date   NO PAST SURGERIES     Social History   Occupational History   Not on file  Tobacco Use   Smoking status: Every Day    Current packs/day: 0.00    Types: Cigarettes    Last attempt to quit: 01/30/2019    Years since quitting: 4.8   Smokeless tobacco: Never  Vaping Use   Vaping status: Never Used  Substance and Sexual Activity   Alcohol use: Yes    Comment: ocassionally   Drug use: Not Currently    Types: Marijuana    Comment: none since end of June   Sexual activity: Yes    Birth control/protection: None

## 2023-12-27 NOTE — Therapy (Incomplete)
 OUTPATIENT PHYSICAL THERAPY THORACOLUMBAR TREATMENT   Patient Name: Regina Cross MRN: [id number] DOB:10-Apr-1976, 48 y.o., female Today's Date: 12/27/2023  END OF SESSION:    Past Medical History:  Diagnosis Date   Gestational diabetes    Past Surgical History:  Procedure Laterality Date   NO PAST SURGERIES     Patient Active Problem List   Diagnosis Date Noted   Encounter for induction of labor 09/14/2019   Transient hypertension of pregnancy in third trimester 08/01/2019   Gestational diabetes mellitus (GDM) affecting pregnancy 06/19/2019   Patient noncompliance 05/02/2019   AMA (advanced maternal age) multigravida 35+ 04/18/2019   Abnormal fetal ultrasound 02/23/2019   Supervision of high risk pregnancy, antepartum 01/30/2019    PCP: Marius Siemens, NP   REFERRING PROVIDER: Agustina Aldrich, MD   REFERRING DIAG: M54.16 (ICD-10-CM) - Radiculopathy, lumbar region   Rationale for Evaluation and Treatment: Rehabilitation  THERAPY DIAG:  No diagnosis found.  ONSET DATE: Mid-February  SUBJECTIVE:                                                                                                                                                                                           SUBJECTIVE STATEMENT:   EVAL: Pt reports a Hx of intermittent chronic low back pain, but in the middle of February it developed into significant pain. Since then the pain has improved, but she has daily back and L hip pain. She feels that her low back and hip pain are 2 separate issues, Pt is afraid the internse pain will return.  PERTINENT HISTORY:  See PMH  PAIN:  Are you having pain? Yes: NPRS scale: 6/10 Pain location: midline low back Pain description: ache, sharp Aggravating factors: Not particularly Relieving factors: Unsure  Are you having pain? Yes: NPRS scale: 3/10 Pain location: L lateral hip Pain description: throb, pops Aggravating factors: No  Relieving factors:  None  PRECAUTIONS: None  RED FLAGS: None   WEIGHT BEARING RESTRICTIONS: No  FALLS:  Has patient fallen in last 6 months? No  LIVING ENVIRONMENT: Lives with: lives with their family Lives in: House/apartment Ableto access home  OCCUPATION: Not working  PLOF: Independent  PATIENT GOALS: Less pain and to move better  NEXT MD VISIT: Not currently  OBJECTIVE:  Note: Objective measures were completed at Evaluation unless otherwise noted.  DIAGNOSTIC FINDINGS:  MRI Lumbar Spine- 10/28/23   IMPRESSION: Mild facet arthropathy of the levels. No disc herniation. No canal or foraminal stenosis.  PATIENT SURVEYS:  Modified Oswestry 29/50=58%   COGNITION: Overall cognitive status: Within functional limits for tasks assessed     SENSATION: [facility]  MUSCLE  LENGTH: Hamstrings: Right WNLS deg; Left WNLS deg Andy Bannister test: Right WNLS deg; Left WNLS deg  POSTURE: rounded shoulders, forward head, and increased lumbar lordosis  PALPATION: TTP to the lower thoraci and lumbar region. TTP to the distal greater trochanter area  LUMBAR ROM:   AROM eval  Flexion 75%; tight LB and hams  Extension 50%, no P  Right lateral flexion 75%, P  Left lateral flexion 75%, P  Right rotation 75%, P and tight  Left rotation 75%, P and tight   (Blank rows = not tested)  LOWER EXTREMITY ROM:     WFLs Active  Right eval Left eval  Hip flexion    Hip extension    Hip abduction    Hip adduction    Hip internal rotation    Hip external rotation    Knee flexion    Knee extension    Ankle dorsiflexion    Ankle plantarflexion    Ankle inversion    Ankle eversion     (Blank rows = not tested)  LOWER EXTREMITY MMT:    Myotome screen was negative. Weak core. MMT Right eval Left eval  Hip flexion 4 4  Hip extension 3+ 3+  Hip abduction 4 4  Hip adduction    Hip internal rotation    Hip external rotation 4 4  Knee flexion    Knee extension    Ankle dorsiflexion    Ankle  plantarflexion    Ankle inversion    Ankle eversion     (Blank rows = not tested)  LUMBAR SPECIAL TESTS:  Straight leg raise test: Negative, Slump test: Negative, and SI Compression/distraction test: Negative  FUNCTIONAL TESTS:  5 times sit to stand: TBA  GAIT: Distance walked: 200' Assistive device utilized: None Level of assistance: Complete Independence Comments: WNLs  TREATMENT DATE:  [facility] Adult PT Treatment:                                                DATE: 12/28/23 Therapeutic Exercise: Hooklying Single Knee to Chest  - 1 x daily - 7 x weekly - 1 sets - 3 reps - 20 hold Supine Double Knee to Chest  - 1 x daily - 7 x weekly - 1 sets - 3 reps - 20 hold Supine Lower Trunk Rotation  - 1 x daily - 7 x weekly - 1 sets - 10 reps - 3 hold Supine Posterior Pelvic Tilt  - 1 x daily - 7 x weekly - 1 sets - 10 reps - 3 hold Supine Piriformis Stretch with Foot on Ground  - 1 x daily - 7 x weekly - 1 sets - 3 reps - 20 hold Supine Figure 4 Piriformis Stretch  - 1 x daily - 7 x weekly - 1 sets - 3 reps - 20 hold Hooklying Clamshell with Resistance  - 1 x daily - 7 x weekly - 1 sets - 10 reps - 5 hold Manual Therapy: *** Neuromuscular re-ed: *** Therapeutic Activity: *** Modalities: *** Self Care: ***  [facility] Adult PT Treatment:                                                DATE: 12/13/23 Therapeutic Exercise:  Developed, instructed in, and pt completed therex as noted in HEP   PATIENT EDUCATION:  Education details: Eval findings, POC, HEP Person educated: Patient Education method: Explanation, Demonstration, Tactile cues, and Verbal cues Education comprehension: verbalized understanding, returned demonstration, verbal cues required, and tactile cues required  HOME EXERCISE PROGRAM: Access Code: [email or web address] URL: [email or web address] Date: 12/13/2023 Prepared by: Liborio Reeds  Exercises - Hooklying Single Knee to Chest  - 1 x daily - 7 x weekly - 1 sets - 3 reps  - 20 hold - Supine Double Knee to Chest  - 1 x daily - 7 x weekly - 1 sets - 3 reps - 20 hold - Supine Lower Trunk Rotation  - 1 x daily - 7 x weekly - 1 sets - 10 reps - 3 hold - Supine Posterior Pelvic Tilt  - 1 x daily - 7 x weekly - 1 sets - 10 reps - 3 hold - Supine Piriformis Stretch with Foot on Ground  - 1 x daily - 7 x weekly - 1 sets - 3 reps - 20 hold - Supine Figure 4 Piriformis Stretch  - 1 x daily - 7 x weekly - 1 sets - 3 reps - 20 hold - Hooklying Clamshell with Resistance  - 1 x daily - 7 x weekly - 1 sets - 10 reps - 5 hold  ASSESSMENT:  CLINICAL IMPRESSION: EVAL: Patient is a 48 y.o. female who was seen today for physical therapy evaluation and treatment for M54.16 (ICD-10-CM) - Radiculopathy, lumbar region. Pt presents with low back pain which appearing related to increased lordosis and weak hips and core. L hip pain appears related to tendinopathy/bursitis. A HEP was initiated. Pt will benefit from skilled PT 2w6 to address impairments to optimize back and hip function with less pain.   OBJECTIVE IMPAIRMENTS: decreased activity tolerance, decreased strength, postural dysfunction, and pain.   ACTIVITY LIMITATIONS: carrying, lifting, bending, squatting, and sleeping  PARTICIPATION LIMITATIONS: meal prep, cleaning, laundry, and community activity  PERSONAL FACTORS: Past/current experiences and Time since onset of injury/illness/exacerbation are also affecting patient's functional outcome.   REHAB POTENTIAL: Good  CLINICAL DECISION MAKING: Evolving/moderate complexity  EVALUATION COMPLEXITY: Moderate   GOALS:   SHORT TERM GOALS =LTGS   LONG TERM GOALS: Target date: 02/03/24  Pt will be Ind in a final HEP to maintain achieved LOF  Baseline: started Goal status: INITIAL  2.  Increase bilat hip strength to 4+/5 for improved lumbopelvic strength to help suppport the pt's low back Baseline: see flow sheets Goal status: INITIAL  3.  Pt will report 50% improvement  in low back and L hip pain with ADLs for improved function and QOL Baseline:  Goal status: INITIAL  4.  Pt will demonstrate proper body mechanics for household activities Baseline:  Goal status: INITIAL  5.  Improve 5xSTS by MCID of 5" as indication of improved functional mobility  Baseline: TBA Goal status: INITIAL  6.  Pt's MODI score will improved to the predicted value of 46% or less as indication of improved function  Baseline: 58% Goal status: INITIAL  PLAN:  PT FREQUENCY: 2x/week  PT DURATION: 6 weeks  PLANNED INTERVENTIONS: 97164- PT Re-evaluation, 97110-Therapeutic exercises, 97530- Therapeutic activity, 97112- Neuromuscular re-education, 97535- Self Care, 52841- Manual therapy, [id number]- Aquatic Therapy, [id number]- Electrical stimulation (unattended), Patient/Family education, Taping, Dry Needling, Joint mobilization, Spinal mobilization, Cryotherapy, and Moist heat.  PLAN FOR NEXT SESSION: Assess 5xSTS; assess response to HEP; progress therex as indicated; use of modalities, manual therapy;  and TPDN as indicated.   Kevonte Vanecek MS, PT 12/27/23 11:51 AM   For all possible CPT codes, reference the Planned Interventions line above.     Check all conditions that are expected to impact treatment: {Conditions expected to impact treatment:Musculoskeletal disorders   If treatment provided at initial evaluation, no treatment charged due to lack of authorization.

## 2023-12-28 ENCOUNTER — Ambulatory Visit

## 2024-01-03 ENCOUNTER — Encounter: Admitting: Pediatrics

## 2024-01-10 ENCOUNTER — Ambulatory Visit: Attending: Neurosurgery

## 2024-01-10 ENCOUNTER — Telehealth: Payer: Self-pay

## 2024-01-10 NOTE — Telephone Encounter (Signed)
 LVM re: 1st no show appt. Advised pt of the attendance policy and of her upcoming PT appt.

## 2024-01-10 NOTE — Therapy (Incomplete)
 OUTPATIENT PHYSICAL THERAPY THORACOLUMBAR TREATMENT   Patient Name: Regina Cross MRN: [id number] DOB:14-Jun-1976, 48 y.o., female Today's Date: 01/10/2024  END OF SESSION:    Past Medical History:  Diagnosis Date   Gestational diabetes    Past Surgical History:  Procedure Laterality Date   NO PAST SURGERIES     Patient Active Problem List   Diagnosis Date Noted   Encounter for induction of labor 09/14/2019   Transient hypertension of pregnancy in third trimester 08/01/2019   Gestational diabetes mellitus (GDM) affecting pregnancy 06/19/2019   Patient noncompliance 05/02/2019   AMA (advanced maternal age) multigravida 35+ 04/18/2019   Abnormal fetal ultrasound 02/23/2019   Supervision of high risk pregnancy, antepartum 01/30/2019    PCP: Marius Siemens, NP   REFERRING PROVIDER: Agustina Aldrich, MD   REFERRING DIAG: M54.16 (ICD-10-CM) - Radiculopathy, lumbar region   Rationale for Evaluation and Treatment: Rehabilitation  THERAPY DIAG:  No diagnosis found.  ONSET DATE: Mid-February  SUBJECTIVE:                                                                                                                                                                                           SUBJECTIVE STATEMENT:   EVAL: Pt reports a Hx of intermittent chronic low back pain, but in the middle of February it developed into significant pain. Since then the pain has improved, but she has daily back and L hip pain. She feels that her low back and hip pain are 2 separate issues, Pt is afraid the internse pain will return.  PERTINENT HISTORY:  See PMH  PAIN:  Are you having pain? Yes: NPRS scale: 6/10 Pain location: midline low back Pain description: ache, sharp Aggravating factors: Not particularly Relieving factors: Unsure  Are you having pain? Yes: NPRS scale: 3/10 Pain location: L lateral hip Pain description: throb, pops Aggravating factors: No  Relieving factors:  None  PRECAUTIONS: None  RED FLAGS: None   WEIGHT BEARING RESTRICTIONS: No  FALLS:  Has patient fallen in last 6 months? No  LIVING ENVIRONMENT: Lives with: lives with their family Lives in: House/apartment Ableto access home  OCCUPATION: Not working  PLOF: Independent  PATIENT GOALS: Less pain and to move better  NEXT MD VISIT: Not currently  OBJECTIVE:  Note: Objective measures were completed at Evaluation unless otherwise noted.  DIAGNOSTIC FINDINGS:  MRI Lumbar Spine- 10/28/23   IMPRESSION: Mild facet arthropathy of the levels. No disc herniation. No canal or foraminal stenosis.  PATIENT SURVEYS:  Modified Oswestry 29/50=58%   COGNITION: Overall cognitive status: Within functional limits for tasks assessed     SENSATION: [facility]  MUSCLE  LENGTH: Hamstrings: Right WNLS deg; Left WNLS deg Andy Bannister test: Right WNLS deg; Left WNLS deg  POSTURE: rounded shoulders, forward head, and increased lumbar lordosis  PALPATION: TTP to the lower thoraci and lumbar region. TTP to the distal greater trochanter area  LUMBAR ROM:   AROM eval  Flexion 75%; tight LB and hams  Extension 50%, no P  Right lateral flexion 75%, P  Left lateral flexion 75%, P  Right rotation 75%, P and tight  Left rotation 75%, P and tight   (Blank rows = not tested)  LOWER EXTREMITY ROM:     WFLs Active  Right eval Left eval  Hip flexion    Hip extension    Hip abduction    Hip adduction    Hip internal rotation    Hip external rotation    Knee flexion    Knee extension    Ankle dorsiflexion    Ankle plantarflexion    Ankle inversion    Ankle eversion     (Blank rows = not tested)  LOWER EXTREMITY MMT:    Myotome screen was negative. Weak core. MMT Right eval Left eval  Hip flexion 4 4  Hip extension 3+ 3+  Hip abduction 4 4  Hip adduction    Hip internal rotation    Hip external rotation 4 4  Knee flexion    Knee extension    Ankle dorsiflexion    Ankle  plantarflexion    Ankle inversion    Ankle eversion     (Blank rows = not tested)  LUMBAR SPECIAL TESTS:  Straight leg raise test: Negative, Slump test: Negative, and SI Compression/distraction test: Negative  FUNCTIONAL TESTS:  5 times sit to stand: TBA  GAIT: Distance walked: 200' Assistive device utilized: None Level of assistance: Complete Independence Comments: WNLs  TREATMENT DATE:  [facility] Adult PT Treatment:                                                DATE: 01/10/24 Therapeutic Exercise: Hooklying Single Knee to Chest  - 1 x daily - 7 x weekly - 1 sets - 3 reps - 20 hold Supine Double Knee to Chest  - 1 x daily - 7 x weekly - 1 sets - 3 reps - 20 hold Supine Lower Trunk Rotation  - 1 x daily - 7 x weekly - 1 sets - 10 reps - 3 hold Supine Posterior Pelvic Tilt  - 1 x daily - 7 x weekly - 1 sets - 10 reps - 3 hold Supine Piriformis Stretch with Foot on Ground  - 1 x daily - 7 x weekly - 1 sets - 3 reps - 20 hold Supine Figure 4 Piriformis Stretch  - 1 x daily - 7 x weekly - 1 sets - 3 reps - 20 hold Hooklying Clamshell with Resistance  - 1 x daily - 7 x weekly - 1 sets - 10 reps - 5 hold Manual Therapy: *** Neuromuscular re-ed: *** Therapeutic Activity: *** Modalities: *** Self Care: ***  [facility] Adult PT Treatment:                                                DATE: 12/13/23 Therapeutic Exercise:  Developed, instructed in, and pt completed therex as noted in HEP   PATIENT EDUCATION:  Education details: Eval findings, POC, HEP Person educated: Patient Education method: Explanation, Demonstration, Tactile cues, and Verbal cues Education comprehension: verbalized understanding, returned demonstration, verbal cues required, and tactile cues required  HOME EXERCISE PROGRAM: Access Code: [email or web address] URL: [email or web address] Date: 12/13/2023 Prepared by: Liborio Reeds  Exercises - Hooklying Single Knee to Chest  - 1 x daily - 7 x weekly - 1 sets - 3 reps  - 20 hold - Supine Double Knee to Chest  - 1 x daily - 7 x weekly - 1 sets - 3 reps - 20 hold - Supine Lower Trunk Rotation  - 1 x daily - 7 x weekly - 1 sets - 10 reps - 3 hold - Supine Posterior Pelvic Tilt  - 1 x daily - 7 x weekly - 1 sets - 10 reps - 3 hold - Supine Piriformis Stretch with Foot on Ground  - 1 x daily - 7 x weekly - 1 sets - 3 reps - 20 hold - Supine Figure 4 Piriformis Stretch  - 1 x daily - 7 x weekly - 1 sets - 3 reps - 20 hold - Hooklying Clamshell with Resistance  - 1 x daily - 7 x weekly - 1 sets - 10 reps - 5 hold  ASSESSMENT:  CLINICAL IMPRESSION: EVAL: Patient is a 48 y.o. female who was seen today for physical therapy evaluation and treatment for M54.16 (ICD-10-CM) - Radiculopathy, lumbar region. Pt presents with low back pain which appearing related to increased lordosis and weak hips and core. L hip pain appears related to tendinopathy/bursitis. A HEP was initiated. Pt will benefit from skilled PT 2w6 to address impairments to optimize back and hip function with less pain.   OBJECTIVE IMPAIRMENTS: decreased activity tolerance, decreased strength, postural dysfunction, and pain.   ACTIVITY LIMITATIONS: carrying, lifting, bending, squatting, and sleeping  PARTICIPATION LIMITATIONS: meal prep, cleaning, laundry, and community activity  PERSONAL FACTORS: Past/current experiences and Time since onset of injury/illness/exacerbation are also affecting patient's functional outcome.   REHAB POTENTIAL: Good  CLINICAL DECISION MAKING: Evolving/moderate complexity  EVALUATION COMPLEXITY: Moderate   GOALS:   SHORT TERM GOALS =LTGS   LONG TERM GOALS: Target date: 02/03/24  Pt will be Ind in a final HEP to maintain achieved LOF  Baseline: started Goal status: INITIAL  2.  Increase bilat hip strength to 4+/5 for improved lumbopelvic strength to help suppport the pt's low back Baseline: see flow sheets Goal status: INITIAL  3.  Pt will report 50% improvement  in low back and L hip pain with ADLs for improved function and QOL Baseline:  Goal status: INITIAL  4.  Pt will demonstrate proper body mechanics for household activities Baseline:  Goal status: INITIAL  5.  Improve 5xSTS by MCID of 5" as indication of improved functional mobility  Baseline: TBA Goal status: INITIAL  6.  Pt's MODI score will improved to the predicted value of 46% or less as indication of improved function  Baseline: 58% Goal status: INITIAL  PLAN:  PT FREQUENCY: 2x/week  PT DURATION: 6 weeks  PLANNED INTERVENTIONS: 97164- PT Re-evaluation, 97110-Therapeutic exercises, 97530- Therapeutic activity, 97112- Neuromuscular re-education, 97535- Self Care, 54098- Manual therapy, [id number]- Aquatic Therapy, [id number]- Electrical stimulation (unattended), Patient/Family education, Taping, Dry Needling, Joint mobilization, Spinal mobilization, Cryotherapy, and Moist heat.  PLAN FOR NEXT SESSION: Assess 5xSTS; assess response to HEP; progress therex as indicated; use of modalities, manual therapy;  and TPDN as indicated.   Myron Lona MS, PT 01/10/24 7:40 AM

## 2024-01-11 ENCOUNTER — Ambulatory Visit (INDEPENDENT_AMBULATORY_CARE_PROVIDER_SITE_OTHER): Payer: Self-pay | Admitting: Primary Care

## 2024-01-12 ENCOUNTER — Ambulatory Visit

## 2024-01-12 NOTE — Therapy (Incomplete)
 OUTPATIENT PHYSICAL THERAPY THORACOLUMBAR TREATMENT   Patient Name: Regina Cross MRN: [id number] DOB:05/27/1976, 48 y.o., female Today's Date: 01/12/2024  END OF SESSION:    Past Medical History:  Diagnosis Date   Gestational diabetes    Past Surgical History:  Procedure Laterality Date   NO PAST SURGERIES     Patient Active Problem List   Diagnosis Date Noted   Encounter for induction of labor 09/14/2019   Transient hypertension of pregnancy in third trimester 08/01/2019   Gestational diabetes mellitus (GDM) affecting pregnancy 06/19/2019   Patient noncompliance 05/02/2019   AMA (advanced maternal age) multigravida 35+ 04/18/2019   Abnormal fetal ultrasound 02/23/2019   Supervision of high risk pregnancy, antepartum 01/30/2019    PCP: Marius Siemens, NP   REFERRING PROVIDER: Agustina Aldrich, MD   REFERRING DIAG: M54.16 (ICD-10-CM) - Radiculopathy, lumbar region   Rationale for Evaluation and Treatment: Rehabilitation  THERAPY DIAG:  No diagnosis found.  ONSET DATE: Mid-February  SUBJECTIVE:                                                                                                                                                                                           SUBJECTIVE STATEMENT:   EVAL: Pt reports a Hx of intermittent chronic low back pain, but in the middle of February it developed into significant pain. Since then the pain has improved, but she has daily back and L hip pain. She feels that her low back and hip pain are 2 separate issues, Pt is afraid the internse pain will return.  PERTINENT HISTORY:  See PMH  PAIN:  Are you having pain? Yes: NPRS scale: 6/10 Pain location: midline low back Pain description: ache, sharp Aggravating factors: Not particularly Relieving factors: Unsure  Are you having pain? Yes: NPRS scale: 3/10 Pain location: L lateral hip Pain description: throb, pops Aggravating factors: No  Relieving factors:  None  PRECAUTIONS: None  RED FLAGS: None   WEIGHT BEARING RESTRICTIONS: No  FALLS:  Has patient fallen in last 6 months? No  LIVING ENVIRONMENT: Lives with: lives with their family Lives in: House/apartment Ableto access home  OCCUPATION: Not working  PLOF: Independent  PATIENT GOALS: Less pain and to move better  NEXT MD VISIT: Not currently  OBJECTIVE:  Note: Objective measures were completed at Evaluation unless otherwise noted.  DIAGNOSTIC FINDINGS:  MRI Lumbar Spine- 10/28/23   IMPRESSION: Mild facet arthropathy of the levels. No disc herniation. No canal or foraminal stenosis.  PATIENT SURVEYS:  Modified Oswestry 29/50=58%   COGNITION: Overall cognitive status: Within functional limits for tasks assessed     SENSATION: [facility]  MUSCLE  LENGTH: Hamstrings: Right WNLS deg; Left WNLS deg Andy Bannister test: Right WNLS deg; Left WNLS deg  POSTURE: rounded shoulders, forward head, and increased lumbar lordosis  PALPATION: TTP to the lower thoraci and lumbar region. TTP to the distal greater trochanter area  LUMBAR ROM:   AROM eval  Flexion 75%; tight LB and hams  Extension 50%, no P  Right lateral flexion 75%, P  Left lateral flexion 75%, P  Right rotation 75%, P and tight  Left rotation 75%, P and tight   (Blank rows = not tested)  LOWER EXTREMITY ROM:     WFLs Active  Right eval Left eval  Hip flexion    Hip extension    Hip abduction    Hip adduction    Hip internal rotation    Hip external rotation    Knee flexion    Knee extension    Ankle dorsiflexion    Ankle plantarflexion    Ankle inversion    Ankle eversion     (Blank rows = not tested)  LOWER EXTREMITY MMT:    Myotome screen was negative. Weak core. MMT Right eval Left eval  Hip flexion 4 4  Hip extension 3+ 3+  Hip abduction 4 4  Hip adduction    Hip internal rotation    Hip external rotation 4 4  Knee flexion    Knee extension    Ankle dorsiflexion    Ankle  plantarflexion    Ankle inversion    Ankle eversion     (Blank rows = not tested)  LUMBAR SPECIAL TESTS:  Straight leg raise test: Negative, Slump test: Negative, and SI Compression/distraction test: Negative  FUNCTIONAL TESTS:  5 times sit to stand: TBA  GAIT: Distance walked: 200' Assistive device utilized: None Level of assistance: Complete Independence Comments: WNLs  TREATMENT DATE:  [facility] Adult PT Treatment:                                                DATE: 01/12/24 Therapeutic Exercise: Hooklying Single Knee to Chest  - 1 x daily - 7 x weekly - 1 sets - 3 reps - 20 hold Supine Double Knee to Chest  - 1 x daily - 7 x weekly - 1 sets - 3 reps - 20 hold Supine Lower Trunk Rotation  - 1 x daily - 7 x weekly - 1 sets - 10 reps - 3 hold Supine Posterior Pelvic Tilt  - 1 x daily - 7 x weekly - 1 sets - 10 reps - 3 hold Supine Piriformis Stretch with Foot on Ground  - 1 x daily - 7 x weekly - 1 sets - 3 reps - 20 hold Supine Figure 4 Piriformis Stretch  - 1 x daily - 7 x weekly - 1 sets - 3 reps - 20 hold Hooklying Clamshell with Resistance  - 1 x daily - 7 x weekly - 1 sets - 10 reps - 5 hold Manual Therapy: *** Neuromuscular re-ed: *** Therapeutic Activity: *** Modalities: *** Self Care: ***  [facility] Adult PT Treatment:                                                DATE: 12/13/23 Therapeutic Exercise:  Developed, instructed in, and pt completed therex as noted in HEP   PATIENT EDUCATION:  Education details: Eval findings, POC, HEP Person educated: Patient Education method: Explanation, Demonstration, Tactile cues, and Verbal cues Education comprehension: verbalized understanding, returned demonstration, verbal cues required, and tactile cues required  HOME EXERCISE PROGRAM: Access Code: [email or web address] URL: [email or web address] Date: 12/13/2023 Prepared by: Liborio Reeds  Exercises - Hooklying Single Knee to Chest  - 1 x daily - 7 x weekly - 1 sets - 3 reps  - 20 hold - Supine Double Knee to Chest  - 1 x daily - 7 x weekly - 1 sets - 3 reps - 20 hold - Supine Lower Trunk Rotation  - 1 x daily - 7 x weekly - 1 sets - 10 reps - 3 hold - Supine Posterior Pelvic Tilt  - 1 x daily - 7 x weekly - 1 sets - 10 reps - 3 hold - Supine Piriformis Stretch with Foot on Ground  - 1 x daily - 7 x weekly - 1 sets - 3 reps - 20 hold - Supine Figure 4 Piriformis Stretch  - 1 x daily - 7 x weekly - 1 sets - 3 reps - 20 hold - Hooklying Clamshell with Resistance  - 1 x daily - 7 x weekly - 1 sets - 10 reps - 5 hold  ASSESSMENT:  CLINICAL IMPRESSION: EVAL: Patient is a 48 y.o. female who was seen today for physical therapy evaluation and treatment for M54.16 (ICD-10-CM) - Radiculopathy, lumbar region. Pt presents with low back pain which appearing related to increased lordosis and weak hips and core. L hip pain appears related to tendinopathy/bursitis. A HEP was initiated. Pt will benefit from skilled PT 2w6 to address impairments to optimize back and hip function with less pain.   OBJECTIVE IMPAIRMENTS: decreased activity tolerance, decreased strength, postural dysfunction, and pain.   ACTIVITY LIMITATIONS: carrying, lifting, bending, squatting, and sleeping  PARTICIPATION LIMITATIONS: meal prep, cleaning, laundry, and community activity  PERSONAL FACTORS: Past/current experiences and Time since onset of injury/illness/exacerbation are also affecting patient's functional outcome.   REHAB POTENTIAL: Good  CLINICAL DECISION MAKING: Evolving/moderate complexity  EVALUATION COMPLEXITY: Moderate   GOALS:   SHORT TERM GOALS =LTGS   LONG TERM GOALS: Target date: 02/03/24  Pt will be Ind in a final HEP to maintain achieved LOF  Baseline: started Goal status: INITIAL  2.  Increase bilat hip strength to 4+/5 for improved lumbopelvic strength to help suppport the pt's low back Baseline: see flow sheets Goal status: INITIAL  3.  Pt will report 50% improvement  in low back and L hip pain with ADLs for improved function and QOL Baseline:  Goal status: INITIAL  4.  Pt will demonstrate proper body mechanics for household activities Baseline:  Goal status: INITIAL  5.  Improve 5xSTS by MCID of 5" as indication of improved functional mobility  Baseline: TBA Goal status: INITIAL  6.  Pt's MODI score will improved to the predicted value of 46% or less as indication of improved function  Baseline: 58% Goal status: INITIAL  PLAN:  PT FREQUENCY: 2x/week  PT DURATION: 6 weeks  PLANNED INTERVENTIONS: 97164- PT Re-evaluation, 97110-Therapeutic exercises, 97530- Therapeutic activity, 97112- Neuromuscular re-education, 97535- Self Care, 44010- Manual therapy, [id number]- Aquatic Therapy, [id number]- Electrical stimulation (unattended), Patient/Family education, Taping, Dry Needling, Joint mobilization, Spinal mobilization, Cryotherapy, and Moist heat.  PLAN FOR NEXT SESSION: Assess 5xSTS; assess response to HEP; progress therex as indicated; use of modalities, manual therapy;  and TPDN as indicated.   Amiya Escamilla MS, PT 01/12/24 6:24 AM

## 2024-01-18 ENCOUNTER — Telehealth (INDEPENDENT_AMBULATORY_CARE_PROVIDER_SITE_OTHER): Payer: Self-pay | Admitting: Primary Care

## 2024-01-18 NOTE — Telephone Encounter (Signed)
 Left VM with pt about their upcoming appt.

## 2024-01-19 ENCOUNTER — Ambulatory Visit: Admitting: Physical Therapy

## 2024-01-19 ENCOUNTER — Ambulatory Visit (INDEPENDENT_AMBULATORY_CARE_PROVIDER_SITE_OTHER): Admitting: Primary Care

## 2024-01-19 ENCOUNTER — Ambulatory Visit: Payer: Self-pay | Admitting: Physical Therapy

## 2024-02-06 ENCOUNTER — Ambulatory Visit (INDEPENDENT_AMBULATORY_CARE_PROVIDER_SITE_OTHER): Admitting: Primary Care

## 2024-02-06 ENCOUNTER — Encounter (INDEPENDENT_AMBULATORY_CARE_PROVIDER_SITE_OTHER): Payer: Self-pay

## 2024-02-06 NOTE — Progress Notes (Deleted)
 [facility] History and Physical   Primary Care Physician:  Celestia Rosaline SQUIBB, NP   Reason for Procedure:  Colorectal cancer screening  Plan:    Screening colonoscopy     HPI: Regina Cross is a 48 y.o. female undergoing colonoscopy for colorectal cancer screening.  This is the patient's first colonoscopy.  No documented family history of colorectal cancer or polyps.  Patient denies rectal bleeding or change in bowel habits at the time of this exam.   Past Medical History:  Diagnosis Date   Gestational diabetes     Past Surgical History:  Procedure Laterality Date   NO PAST SURGERIES      Prior to Admission medications   Medication Sig Start Date End Date Taking? Authorizing Provider  acetaminophen  (TYLENOL ) 325 MG tablet Take 2 tablets (650 mg total) by mouth every 6 (six) hours as needed (for pain scale < 4). Patient not taking: Reported on 10/13/2023 09/16/19   Gene Lucie BROCKS, CNM  diclofenac  (VOLTAREN ) 75 MG EC tablet Take 1 tablet (75 mg total) by mouth 2 (two) times daily as needed. 12/23/23   Jule Ronal CROME, PA-C  ergocalciferol  (VITAMIN D2) 1.25 MG (50000 UT) capsule Take 1 capsule (50,000 Units total) by mouth once a week. 10/27/23   Celestia Rosaline SQUIBB, NP  polyethylene glycol-electrolytes (NULYTELY) 420 g solution Take by mouth. 12/20/23   [provider]    Current Outpatient Medications  Medication Sig Dispense Refill   acetaminophen  (TYLENOL ) 325 MG tablet Take 2 tablets (650 mg total) by mouth every 6 (six) hours as needed (for pain scale < 4). (Patient not taking: Reported on 10/13/2023) 30 tablet 3   diclofenac  (VOLTAREN ) 75 MG EC tablet Take 1 tablet (75 mg total) by mouth 2 (two) times daily as needed. 60 tablet 2   ergocalciferol  (VITAMIN D2) 1.25 MG (50000 UT) capsule Take 1 capsule (50,000 Units total) by mouth once a week. 12 capsule 0   polyethylene glycol-electrolytes (NULYTELY) 420 g solution Take by mouth.     No current  facility-administered medications for this visit.    Allergies as of 02/08/2024 - Review Complete 12/21/2023  Allergen Reaction Noted   Corn-containing products Rash 08/21/2020    Family History  Problem Relation Age of Onset   Hypertension Mother    Stomach cancer Maternal Aunt    Colon cancer Neg Hx    Rectal cancer Neg Hx    Esophageal cancer Neg Hx     Social History   Socioeconomic History   Marital status: Single    Spouse name: Not on file   Number of children: 3   Years of education: Not on file   Highest education level: Not on file  Occupational History   Not on file  Tobacco Use   Smoking status: Every Day    Current packs/day: 0.00    Types: Cigarettes    Last attempt to quit: 01/30/2019    Years since quitting: 5.0   Smokeless tobacco: Never  Vaping Use   Vaping status: Never Used  Substance and Sexual Activity   Alcohol use: Yes    Comment: ocassionally   Drug use: Not Currently    Types: Marijuana    Comment: none since end of June   Sexual activity: Yes    Birth control/protection: None  Other Topics Concern   Not on file  Social History Narrative   Not on file   Social Drivers of Health   Financial Resource Strain: Unknown (01/30/2019)  Overall Financial Resource Strain (CARDIA)    Difficulty of Paying Living Expenses: Patient declined  Food Insecurity: No Food Insecurity (10/27/2023)   Hunger Vital Sign    Worried About Running Out of Food in the Last Year: Never true    Ran Out of Food in the Last Year: Never true  Transportation Needs: Unmet Transportation Needs (10/27/2023)   PRAPARE - Administrator, Civil Service (Medical): Yes    Lack of Transportation (Non-Medical): Yes  Physical Activity: Unknown (01/30/2019)   Exercise Vital Sign    Days of Exercise per Week: Patient declined    Minutes of Exercise per Session: Patient declined  Stress: Unknown (01/30/2019)   [employer] of Occupational Health - Occupational  Stress Questionnaire    Feeling of Stress : Patient declined  Social Connections: Unknown (10/27/2023)   Social Connection and Isolation Panel    Frequency of Communication with Friends and Family: Once a week    Frequency of Social Gatherings with Friends and Family: Once a week    Attends Religious Services: Not on Insurance claims handler of Clubs or Organizations: Not on file    Attends Banker Meetings: Not on file    Marital Status: Not on file  Intimate Partner Violence: Not At Risk (10/27/2023)   Humiliation, Afraid, Rape, and Kick questionnaire    Fear of Current or Ex-Partner: No    Emotionally Abused: No    Physically Abused: No    Sexually Abused: No    Review of Systems:  All other review of systems negative except as mentioned in the HPI.  Physical Exam: Vital signs There were no vitals taken for this visit.  General:   Alert,  Well-developed, well-nourished, pleasant and cooperative in NAD Airway:  Mallampati  Lungs:  Clear throughout to auscultation.   Heart:  Regular rate and rhythm; no murmurs, clicks, rubs,  or gallops. Abdomen:  Soft, nontender and nondistended. Normal bowel sounds.   Neuro/Psych:  Normal mood and affect. A and O x 3  Inocente Hausen, MD [facility]

## 2024-02-08 ENCOUNTER — Telehealth: Payer: Self-pay | Admitting: Pediatrics

## 2024-02-08 ENCOUNTER — Encounter: Admitting: Pediatrics

## 2024-02-08 NOTE — Telephone Encounter (Signed)
 Inbound call from patient stated she is out of town and will like to cancel her procedure for today 6/25 at 3pm. Will call back to reschedule.  Please advise  Thank you

## 2024-02-09 ENCOUNTER — Ambulatory Visit: Attending: Physician Assistant

## 2024-02-20 ENCOUNTER — Ambulatory Visit: Attending: Primary Care | Admitting: Physical Therapy

## 2024-02-27 ENCOUNTER — Ambulatory Visit (INDEPENDENT_AMBULATORY_CARE_PROVIDER_SITE_OTHER): Admitting: Primary Care

## 2024-05-15 ENCOUNTER — Ambulatory Visit: Admitting: Physician Assistant

## 2024-06-06 ENCOUNTER — Ambulatory Visit (INDEPENDENT_AMBULATORY_CARE_PROVIDER_SITE_OTHER): Admitting: Primary Care

## 2024-06-06 VITALS — BP 126/84 | HR 67 | Resp 16 | Wt 129.8 lb

## 2024-06-06 DIAGNOSIS — Z23 Encounter for immunization: Secondary | ICD-10-CM | POA: Diagnosis not present

## 2024-06-06 DIAGNOSIS — F17210 Nicotine dependence, cigarettes, uncomplicated: Secondary | ICD-10-CM

## 2024-06-06 DIAGNOSIS — G8929 Other chronic pain: Secondary | ICD-10-CM | POA: Diagnosis not present

## 2024-06-06 DIAGNOSIS — M546 Pain in thoracic spine: Secondary | ICD-10-CM | POA: Diagnosis not present

## 2024-06-06 DIAGNOSIS — Z720 Tobacco use: Secondary | ICD-10-CM

## 2024-06-06 MED ORDER — NICOTINE 21 MG/24HR TD PT24: 21.0000 mg | MEDICATED_PATCH | Freq: Every day | TRANSDERMAL | 0 refills | Status: DC

## 2024-06-06 MED ORDER — NICOTINE 7 MG/24HR TD PT24: 7.0000 mg | MEDICATED_PATCH | Freq: Every day | TRANSDERMAL | 0 refills | Status: DC

## 2024-06-06 MED ORDER — NICOTINE 14 MG/24HR TD PT24: 14.0000 mg | MEDICATED_PATCH | Freq: Every day | TRANSDERMAL | 0 refills | Status: DC

## 2024-06-06 NOTE — Addendum Note (Signed)
 Addended by: CELESTIA BROWNING on: 06/06/2024 05:00 PM   Modules accepted: Orders, Level of Service

## 2024-06-06 NOTE — Progress Notes (Addendum)
 [facility]  Regina Cross, is a 48 y.o. female  RDW:[id number]  FMW:[id number]  DOB - [birth year]  No chief complaint on file.      Subjective:   Regina Cross is a 48 y.o. female here today for an acute visit.  Back Pain This is a chronic problem. The current episode started more than 1 year ago. The problem occurs intermittently. The problem has been gradually worsening since onset. The pain is present in the thoracic spine. The pain does not radiate. The pain is at a severity of 6/10. The pain is moderate. The pain is Worse during the night. The symptoms are aggravated by bending, lying down, sitting and twisting. Stiffness is present At night. 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    No problems updated.  Comprehensive ROS Pertinent positive and negative noted in HPI   Allergies  Allergen Reactions   Corn-Containing Products Rash    Only baby corn    Past Medical History:  Diagnosis Date   Gestational diabetes     Current Outpatient Medications on File Prior to Visit  Medication Sig Dispense Refill   acetaminophen  (TYLENOL ) 325 MG tablet Take 2 tablets (650 mg total) by mouth every 6 (six) hours as needed (for pain scale < 4). (Patient not taking: Reported on 10/13/2023) 30 tablet 3   diclofenac  (VOLTAREN ) 75 MG EC tablet Take 1 tablet (75 mg total) by mouth 2 (two) times daily as needed. 60 tablet 2   ergocalciferol  (VITAMIN D2) 1.25 MG (50000 UT) capsule Take 1 capsule (50,000 Units total) by mouth once a week. 12 capsule 0   polyethylene glycol-electrolytes (NULYTELY) 420 g solution Take by mouth.     No current facility-administered medications on file prior to visit.   Health Maintenance  Topic Date Due   Pneumococcal Vaccine (1 of 2 - PCV) Never done   Hepatitis B Vaccine (1 of 3 - 19+ 3-dose series) Never done   Colon Cancer Screening  Never done   Flu Shot  Never done   COVID-19 Vaccine (1 - 2025-26 season) Never done   Breast Cancer Screening   11/27/2025   Pap with HPV screening  10/26/2028   DTaP/Tdap/Td vaccine (2 - Td or Tdap) 06/13/2029   Hepatitis C Screening  Completed   HIV Screening  Completed   HPV Vaccine  Aged Out   Meningitis B Vaccine  Aged Out    Objective:    Physical Exam Vitals reviewed.  Constitutional:      Appearance: Normal appearance.  HENT:     Head: Normocephalic.     Right Ear: Tympanic membrane, ear canal and external ear normal.     Left Ear: Tympanic membrane, ear canal and external ear normal.     Nose: Nose normal.     Mouth/Throat:     Mouth: Mucous membranes are moist.  Eyes:     Extraocular Movements: Extraocular movements intact.     Pupils: Pupils are equal, round, and reactive to light.  Cardiovascular:     Rate and Rhythm: Normal rate and regular rhythm.  Pulmonary:     Effort: Pulmonary effort is normal.     Breath sounds: Normal breath sounds.  Abdominal:     General: Bowel sounds are normal.     Palpations: Abdomen is soft.  Musculoskeletal:        General: Normal range of motion.     Cervical back: Normal range of motion.  Skin:    General: Skin is warm and dry.  Neurological:     Mental Status: She is alert and oriented to person, place, and time.  Psychiatric:        Mood and Affect: Mood normal.        Behavior: Behavior normal.        Thought Content: Thought content normal.      Assessment & Plan  Regina Cross was seen today for back pain.  Diagnoses and all orders for this visit:  Chronic bilateral thoracic back pain -     AMB referral to orthopedics  Encounter for immunization -     Flu vaccine trivalent PF, 6mos and older(Flulaval,Afluria,Fluarix,Fluzone)  Tobacco abuse - I have recommended complete cessation of tobacco use. I have discussed various options available for assistance with tobacco cessation including over the counter methods (Nicotine gum, patch and lozenges). We also discussed prescription options (Chantix, Nicotine Inhaler /  Nasal Spray). The patient is  interested in pursuing any prescription tobacco cessation options at this time. - Patient declines at this time.  - Less than 5 minutes spent on counseling.  -     nicotine (NICODERM CQ) 21 mg/24hr patch; Place 1 patch (21 mg total) onto the skin daily. -     nicotine (NICODERM CQ) 14 mg/24hr patch; Place 1 patch (14 mg total) onto the skin daily. -     nicotine (NICODERM CQ) 7 mg/24hr patch; Place 1 patch (7 mg total) onto the skin daily.     Patient have been counseled extensively about nutrition and exercise. Other issues discussed during this visit include: low cholesterol diet, weight control and daily exercise, foot care, annual eye examinations at Ophthalmology, importance of adherence with medications and regular follow-up. We also discussed long term complications of uncontrolled diabetes and hypertension.     The patient was given clear instructions to go to ER or return to medical center if symptoms don't improve, worsen or new problems develop. The patient verbalized understanding. The patient was told to call to get lab results if they haven't heard anything in the next week.   This note has been created with Education officer, environmental. Any transcriptional errors are unintentional.   Regina SHAUNNA Bohr, NP 06/06/2024, 4:39 PM

## 2024-06-06 NOTE — Patient Instructions (Signed)

## 2024-06-18 ENCOUNTER — Encounter: Payer: Self-pay | Admitting: Radiology

## 2024-08-04 ENCOUNTER — Telehealth: Admitting: Family Medicine

## 2024-08-04 DIAGNOSIS — G8929 Other chronic pain: Secondary | ICD-10-CM

## 2024-08-04 NOTE — Progress Notes (Signed)
" °  Because of the severity of your symptoms, I feel your condition warrants further evaluation and I recommend that you be seen in an in person face-to-face visit.   NOTE: There will be NO CHARGE for this E-Visit   If you are having a true medical emergency, please call 911.     For an urgent face to face visit, [facility] has multiple urgent care centers for your convenience.  Click the link below for the full list of locations and hours, walk-in wait times, appointment scheduling options and driving directions:  Urgent Care - [place], [place], [place], [place], [place], [place]  [place]     Your MyChart E-visit questionnaire answers were reviewed by a board certified advanced clinical practitioner to complete your personal care plan based on your specific symptoms.    Thank you for using e-Visits.  I have spent 5 minutes in review of e-visit questionnaire, review and updating patient chart, medical decision making and response to patient.   Roosvelt Mater, PA-C       "

## 2024-08-13 ENCOUNTER — Telehealth (INDEPENDENT_AMBULATORY_CARE_PROVIDER_SITE_OTHER): Payer: Self-pay | Admitting: Primary Care

## 2024-08-13 NOTE — Telephone Encounter (Signed)
 Called pt to reschedule appt. Pt did not answer and could not LVM because mailbox was full. Please advise.

## 2024-08-22 ENCOUNTER — Ambulatory Visit (INDEPENDENT_AMBULATORY_CARE_PROVIDER_SITE_OTHER): Admitting: Primary Care

## 2024-08-29 ENCOUNTER — Encounter: Payer: Self-pay | Admitting: Family Medicine

## 2024-08-29 ENCOUNTER — Ambulatory Visit: Admitting: Family Medicine

## 2024-08-29 ENCOUNTER — Ambulatory Visit
Admission: RE | Admit: 2024-08-29 | Discharge: 2024-08-29 | Disposition: A | Discharge status: Home / Self Care | Source: Ambulatory Visit | Attending: Family Medicine | Admitting: Family Medicine

## 2024-08-29 VITALS — BP 118/70 | HR 88 | Ht 62.0 in | Wt 131.2 lb

## 2024-08-29 DIAGNOSIS — M545 Low back pain, unspecified: Secondary | ICD-10-CM

## 2024-08-29 DIAGNOSIS — M76899 Other specified enthesopathies of unspecified lower limb, excluding foot: Secondary | ICD-10-CM

## 2024-08-29 DIAGNOSIS — S239XXA Sprain of unspecified parts of thorax, initial encounter: Secondary | ICD-10-CM

## 2024-08-29 MED ORDER — MELOXICAM 15 MG PO TABS: 15.0000 mg | ORAL_TABLET | Freq: Every day | ORAL | 0 refills | Status: AC

## 2024-08-29 NOTE — Patient Instructions (Signed)
 Please go get your xrays done at [facility]. You do not need to make an appointment. You can just show up.   Address: [address] [place], [place], [place] [address]

## 2024-08-29 NOTE — Progress Notes (Signed)
 "  Name: Regina Cross   Date of Visit: 08/29/2024   Date of last visit with me: Visit date not found   CHIEF COMPLAINT:  Chief Complaint  Patient presents with   Establish Care    New patient. Left hip pain, left side of back.        HPI:  Discussed the use of AI scribe software for clinical note transcription with the patient, who gave verbal consent to proceed.  History of Present Illness   Regina Cross is a 49 year old female who presents with chronic back pain.  She experiences chronic back pain primarily located in the middle of her back, occasionally shifting to the left or right side. The pain is intermittent, lasting for two to three weeks at a time, and is currently present. There is no radiation of the pain down her legs. She has previously consulted multiple providers and attended one physical therapy session, where she was given exercises to perform at home.  She has undergone an MRI in the past and has been performing the recommended exercises. She finds follow-up visits with the physical therapist unproductive. She has also been prescribed a muscle relaxer in the past, but she has an allergy to corn-containing products.  She also experiences pain in her hip, which she initially ignored for a year before being diagnosed with bursitis. The pain in her hip has been persistent, prompting her to seek further evaluation. She describes the hip pain as random and not necessarily associated with specific activities like lunges. She performs exercises to strengthen her hamstrings and glutes, as she reports walking a lot, which may contribute to her symptoms.  She mentions a previous episode where her back pain escalated to the point where she could barely walk, describing it as a burning sensation and likening it to being shocked repeatedly. This episode led to an emergency room visit.  She uses heat for her upper back pain, which she describes as dull and persistent.  She has tried topical treatments like IcyHot patches, but their effectiveness is variable.         OBJECTIVE:       08/29/2024    2:57 PM  Depression screen PHQ 2/9  Decreased Interest 3  Down, Depressed, Hopeless 3  PHQ - 2 Score 6  Altered sleeping 3  Tired, decreased energy 3  Change in appetite 0  Feeling bad or failure about yourself  3  Trouble concentrating 1  Moving slowly or fidgety/restless 1  Suicidal thoughts 0  PHQ-9 Score 17     BP Readings from Last 3 Encounters:  08/29/24 118/70  06/06/24 126/84  12/06/23 120/81    BP 118/70   Pulse 88   Ht 5' 2 (1.575 m)   Wt 131 lb 3.2 oz (59.5 kg)   SpO2 100%   BMI 24.00 kg/m    Physical Exam          Physical Exam Constitutional:      Appearance: Normal appearance.  Neurological:     General: No focal deficit present.     Mental Status: She is alert and oriented to person, place, and time. Mental status is at baseline.    Lumbar spine:  - Inspection: no gross deformity or scoliosis; no swelling or ecchymosis. No skin changes - Palpation: Mild TTP over the spinous processes,, mild ttp over l3-5 paraspinal muscles, or SI joints b/l - ROM: full active ROM of the lumbar spine in flexion and extension without  pain  ASSESSMENT/PLAN:   Assessment & Plan Lumbar back pain  Thoracic back sprain, initial encounter  Hamstring tendinitis at origin    Assessment and Plan    Lumbar spondylosis with low back pain Chronic low back pain with degenerative changes. Differential includes nerve-related pain. - Prescribed meloxicam  once daily for two weeks. - Ordered repeat lumbar spine x-ray. - Ordered hip x-ray.  Thoracic back muscle strain Intermittent thoracic back pain with muscle spasms due to strain and compensatory mechanisms. - Prescribed muscle relaxer. - Advised heat application.  Proximal hamstring tendinopathy Pain in high hamstring area due to weak hamstrings and glutes, increasing tendon  stress. - Provided hamstring and glute strengthening exercises. - Discussed potential cortisone injection if necessary.         Awais Cobarrubias A. Vita MD [facility] "

## 2024-09-06 ENCOUNTER — Encounter (INDEPENDENT_AMBULATORY_CARE_PROVIDER_SITE_OTHER): Payer: Self-pay | Admitting: Primary Care

## 2024-09-06 ENCOUNTER — Ambulatory Visit (INDEPENDENT_AMBULATORY_CARE_PROVIDER_SITE_OTHER): Admitting: Primary Care

## 2024-09-06 ENCOUNTER — Telehealth (INDEPENDENT_AMBULATORY_CARE_PROVIDER_SITE_OTHER): Payer: Self-pay | Admitting: Primary Care

## 2024-09-06 VITALS — BP 122/72 | HR 84 | Resp 16 | Ht 62.0 in | Wt 133.2 lb

## 2024-09-06 DIAGNOSIS — R251 Tremor, unspecified: Secondary | ICD-10-CM

## 2024-09-06 DIAGNOSIS — M47816 Spondylosis without myelopathy or radiculopathy, lumbar region: Secondary | ICD-10-CM

## 2024-09-06 DIAGNOSIS — M545 Low back pain, unspecified: Secondary | ICD-10-CM

## 2024-09-06 NOTE — Telephone Encounter (Signed)
(  8:56am) 09-05-24 1st Attempt to contact pt and make aware about appt. Pt did not answer and LVM. I thought I documented on the 1st attempt but did not by mistake. Please advise  2nd Attempt to contact pt but no answer to make aware of appt. I will send MyChart message. Please advise

## 2024-09-06 NOTE — Progress Notes (Signed)
 " [facility]  Regina Cross, is a 49 y.o. female  [id number]  [id number]  DOB - 03-21-1976  Chief Complaint  Patient presents with   Back Pain    Pt was seen at another office on 08/29/24       Subjective:   Regina Cross is a 49 y.o. female here today for an acute visit. Patient requesting a neurology referral bilateral tremors she feels something is wrong with her body . C/O back pain seen ortho 08/30/23 refer to note. Dx  Lumbar spondylosis   Back Pain    No problems updated.  Comprehensive ROS Pertinent positive and negative noted in HPI   Allergies[1]  Past Medical History:  Diagnosis Date   Gestational diabetes     Medications Ordered Prior to Encounter[2] Health Maintenance  Topic Date Due   Pneumococcal Vaccine (1 of 2 - PCV) Never done   Hepatitis B Vaccine (1 of 3 - 19+ 3-dose series) Never done   Colon Cancer Screening  Never done   COVID-19 Vaccine (1 - 2025-26 season) 09/14/2024*   Breast Cancer Screening  11/27/2025   Pap with HPV screening  10/26/2028   DTaP/Tdap/Td vaccine (2 - Td or Tdap) 06/13/2029   Flu Shot  Completed   HPV Vaccine (No Doses Required) Completed   Hepatitis C Screening  Completed   HIV Screening  Completed   Meningitis B Vaccine  Aged Out  *Topic was postponed. The date shown is not the original due date.    Objective:   Vitals:   09/06/24 1624  BP: 122/72  Pulse: 84  Resp: 16  SpO2: 98%  Weight: 133 lb 3.2 oz (60.4 kg)  Height: 5' 2 (1.575 m)     Physical Exam Vitals reviewed.  Constitutional:      Appearance: Normal appearance.  HENT:     Head: Normocephalic.     Right Ear: External ear normal.     Left Ear: External ear normal.     Nose: Nose normal.     Mouth/Throat:     Mouth: Mucous membranes are moist.  Eyes:     Extraocular Movements: Extraocular movements intact.  Cardiovascular:     Rate and Rhythm: Normal rate and regular rhythm.  Pulmonary:     Effort: Pulmonary  effort is normal.     Breath sounds: Normal breath sounds.  Abdominal:     General: Bowel sounds are normal.     Palpations: Abdomen is soft.  Musculoskeletal:        General: Normal range of motion.     Cervical back: Normal range of motion and neck supple.  Skin:    General: Skin is warm and dry.  Neurological:     Mental Status: She is alert and oriented to person, place, and time.  Psychiatric:        Mood and Affect: Mood normal.        Behavior: Behavior normal.        Thought Content: Thought content normal.       Assessment & Plan  Regina Cross was seen today for back pain.  Diagnoses and all orders for this visit:  Lumbar back pain 2/2 Lumbar spondylosis -     Ambulatory referral to Pain Clinic  Occasional tremors -     Ambulatory referral to Neurology       Patient have been counseled extensively about nutrition and exercise. Other issues discussed during this visit include: low cholesterol diet, weight control and daily  exercise, foot care, annual eye examinations at Ophthalmology, importance of adherence with medications and regular follow-up. We also discussed long term complications of uncontrolled diabetes and hypertension.   Return if symptoms worsen or fail to improve.  The patient was given clear instructions to go to ER or return to medical center if symptoms don't improve, worsen or new problems develop. The patient verbalized understanding. The patient was told to call to get lab results if they haven't heard anything in the next week.   This note has been created with Education officer, environmental. Any transcriptional errors are unintentional.   Regina SHAUNNA Bohr, NP 09/06/2024, 5:03 PM     [1]  Allergies Allergen Reactions   Corn-Containing Products Rash    Only baby corn  [2]  Current Outpatient Medications on File Prior to Visit  Medication Sig Dispense Refill   meloxicam  (MOBIC ) 15 MG tablet Take 1  tablet (15 mg total) by mouth daily. 30 tablet 0   No current facility-administered medications on file prior to visit.   "

## 2024-09-06 NOTE — Patient Instructions (Addendum)
 For lumbar spondylosis, focus on gentle exercises like pelvic tilts, knee-to-chest stretches, cat-cow, bridges, and bird-dog to improve flexibility, strengthen core muscles, and support spinal alignment, incorporating low-impact cardio like walking, but always consult a doctor or physical therapist first, and stop if pain increases.  Gentle Stretches & Mobility Knee-to-Chest: Lie on your back, pull one knee to your chest, hold, then switch; helps loosen lower back. Cat-Cow: On hands and knees, alternate arching (cow) and rounding (cat) your back to improve spinal mobility. Knee Rolls: Lying down with bent knees, slowly roll knees side to side to gently stretch the lower back.  Core & Strength Building Pelvic Tilt: Lie on your back with bent knees, tighten abs to press lower back to the floor, hold, then release. Bridges: Lift hips off the floor until shoulders, hips, and knees form a line, squeezing glutes, then lower. Bird-Dog: On hands and knees, extend opposite arm and leg, keeping core stable and back flat; great for stability. Plank (Modified): On forearms, lift body while keeping core engaged; start with shorter holds and build up.  Low-Impact Aerobic Activity Walking, Swimming, Biking: These activities are excellent for overall fitness and spinal support without jarring the back.  Important Considerations Listen to Your Body: Stop if you feel sharp or increased pain. Consistency: Aim for regular, gentle movement rather than intense workouts. Professional Guidance: Always get clearance from your healthcare provider before starting.

## 2024-09-14 ENCOUNTER — Telehealth (INDEPENDENT_AMBULATORY_CARE_PROVIDER_SITE_OTHER): Payer: Self-pay

## 2024-09-14 NOTE — Telephone Encounter (Signed)
 My Chart
